# Patient Record
Sex: Female | Born: 1990
Health system: Southern US, Community
[De-identification: ages and names within clinical notes are randomized; demographics above are authoritative.]

## PROBLEM LIST (undated history)

## (undated) ENCOUNTER — Emergency Department (HOSPITAL_COMMUNITY): Admission: EM | Payer: BC Managed Care – PPO

## (undated) DIAGNOSIS — R079 Chest pain, unspecified: Secondary | ICD-10-CM

## (undated) DIAGNOSIS — R519 Headache, unspecified: Secondary | ICD-10-CM

## (undated) DIAGNOSIS — K5190 Ulcerative colitis, unspecified, without complications: Secondary | ICD-10-CM

## (undated) DIAGNOSIS — R51 Headache: Secondary | ICD-10-CM

## (undated) DIAGNOSIS — K219 Gastro-esophageal reflux disease without esophagitis: Secondary | ICD-10-CM

## (undated) HISTORY — DX: Chest pain, unspecified: R07.9

## (undated) HISTORY — PX: OTHER SURGICAL HISTORY: SHX169

---
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
## 2008-02-20 ENCOUNTER — Inpatient Hospital Stay (HOSPITAL_COMMUNITY): Admission: AD | Admit: 2008-02-20 | Discharge: 2008-02-20 | Payer: Self-pay | Admitting: Obstetrics and Gynecology

## 2008-04-14 ENCOUNTER — Emergency Department (HOSPITAL_COMMUNITY): Admission: EM | Admit: 2008-04-14 | Discharge: 2008-04-14 | Payer: Self-pay | Admitting: Emergency Medicine

## 2008-04-14 ENCOUNTER — Encounter: Payer: Self-pay | Admitting: Obstetrics and Gynecology

## 2008-09-04 ENCOUNTER — Ambulatory Visit: Payer: Self-pay | Admitting: Advanced Practice Midwife

## 2008-09-04 ENCOUNTER — Inpatient Hospital Stay (HOSPITAL_COMMUNITY): Admission: AD | Admit: 2008-09-04 | Discharge: 2008-09-05 | Payer: Self-pay | Admitting: Obstetrics & Gynecology

## 2008-09-26 ENCOUNTER — Ambulatory Visit: Payer: Self-pay | Admitting: Advanced Practice Midwife

## 2008-09-26 ENCOUNTER — Inpatient Hospital Stay (HOSPITAL_COMMUNITY): Admission: AD | Admit: 2008-09-26 | Discharge: 2008-09-27 | Payer: Self-pay | Admitting: Obstetrics and Gynecology

## 2008-10-06 ENCOUNTER — Inpatient Hospital Stay (HOSPITAL_COMMUNITY): Admission: AD | Admit: 2008-10-06 | Discharge: 2008-10-06 | Payer: Self-pay | Admitting: Obstetrics and Gynecology

## 2008-10-12 ENCOUNTER — Inpatient Hospital Stay (HOSPITAL_COMMUNITY): Admission: AD | Admit: 2008-10-12 | Discharge: 2008-10-15 | Payer: Self-pay | Admitting: Obstetrics and Gynecology

## 2009-08-16 ENCOUNTER — Emergency Department (HOSPITAL_COMMUNITY): Admission: EM | Admit: 2009-08-16 | Discharge: 2009-08-17 | Payer: Self-pay | Admitting: Emergency Medicine

## 2009-11-07 ENCOUNTER — Ambulatory Visit (HOSPITAL_COMMUNITY): Admission: RE | Admit: 2009-11-07 | Discharge: 2009-11-07 | Payer: Self-pay | Admitting: Obstetrics and Gynecology

## 2010-01-09 IMAGING — US US OB COMP LESS 14 WK
1 series · 14 of 17 positions shown · non-contrast
Comparison: none

OBSTETRICAL ULTRASOUND:
 This ultrasound exam was performed in the [HOSPITAL] Ultrasound Department.  The OB US report was generated in the AS system, and faxed to the ordering physician.  This report is also available in [REDACTED] PACS.

[Series 1: us ob comp less 14 wks · 17 acquisitions, 14 frames shown]
[im 1/17]
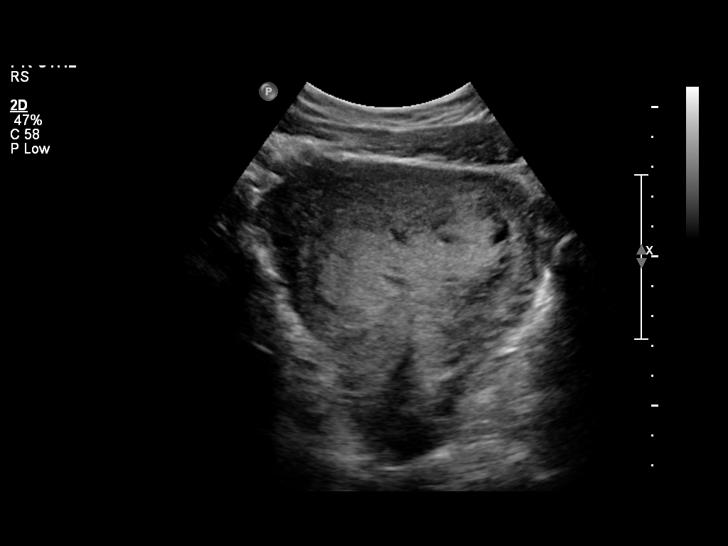
[im 2/17]
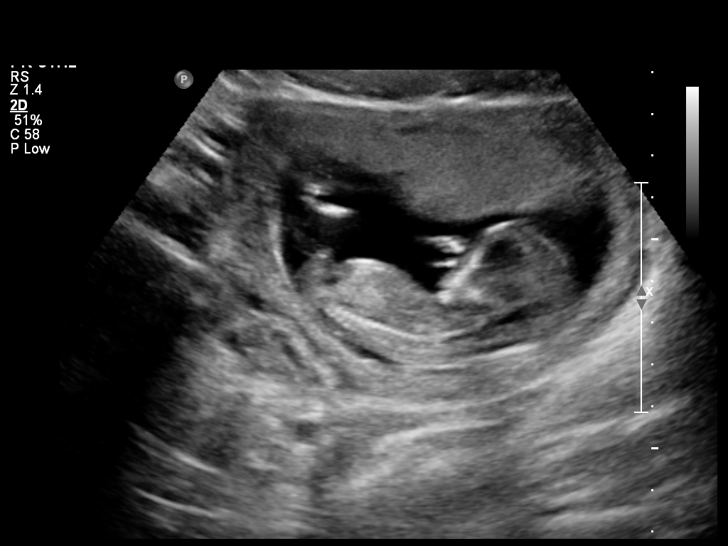
[im 4/17]
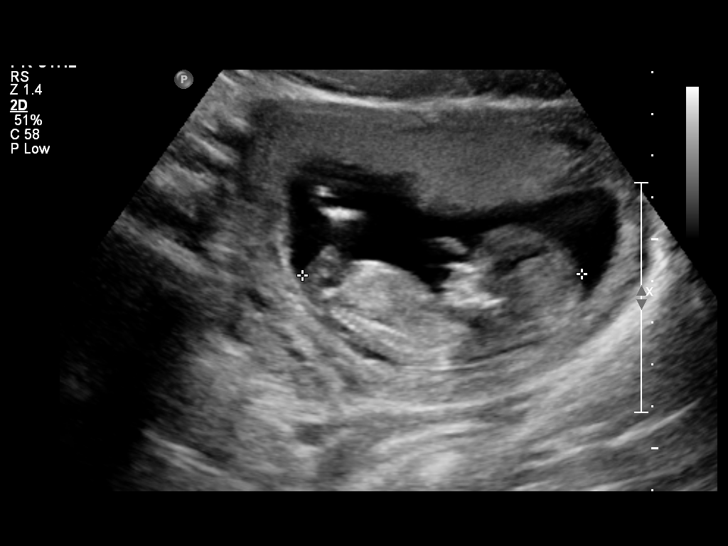
[im 5/17]
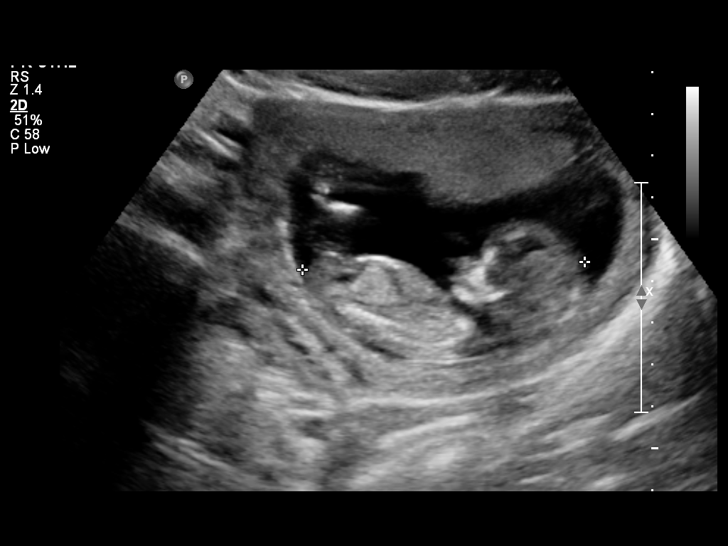
[im 6/17]
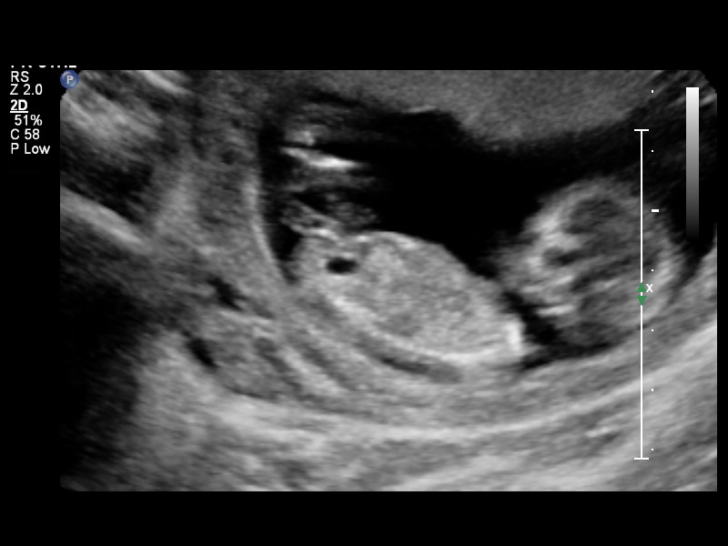
[im 7/17]
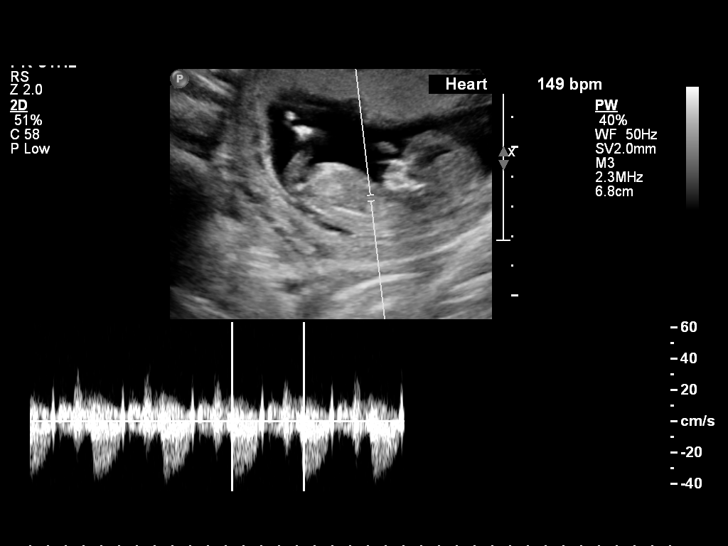
[im 8/17]
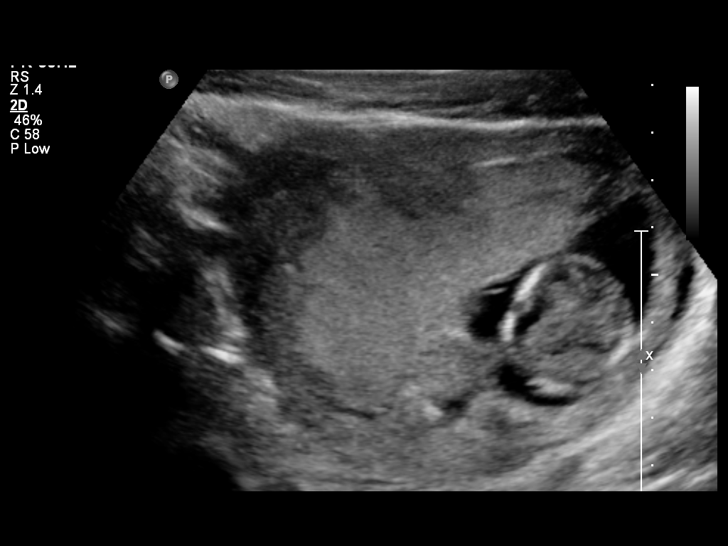
[im 10/17]
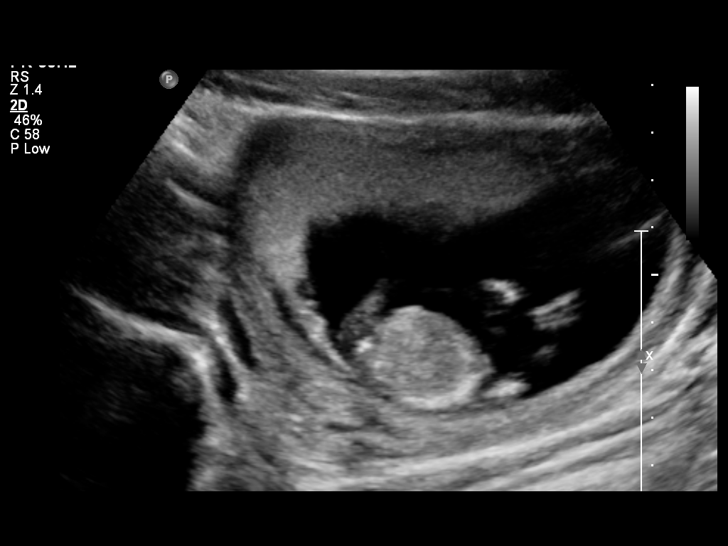
[im 11/17]
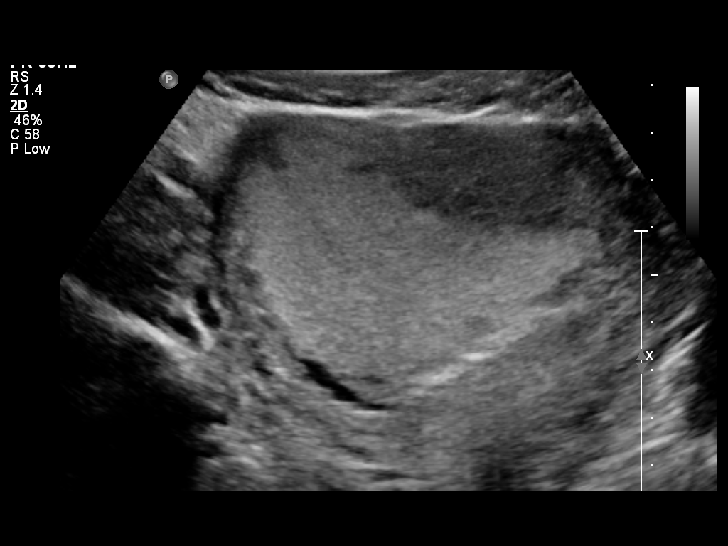
[im 12/17]
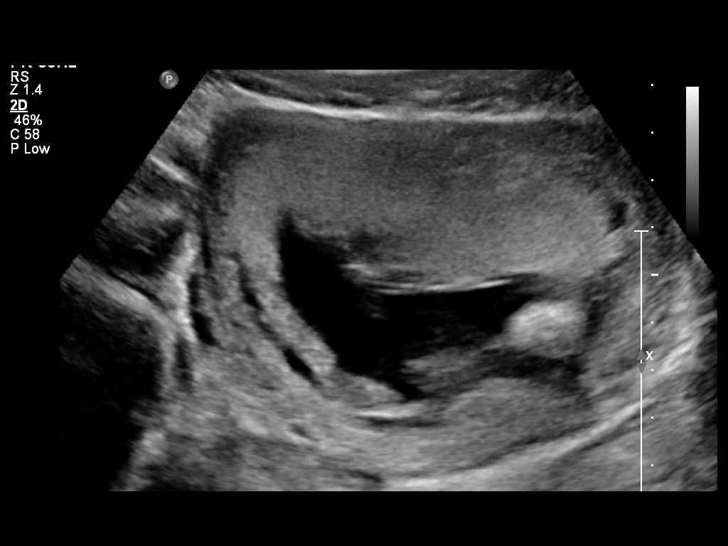
[im 13/17]
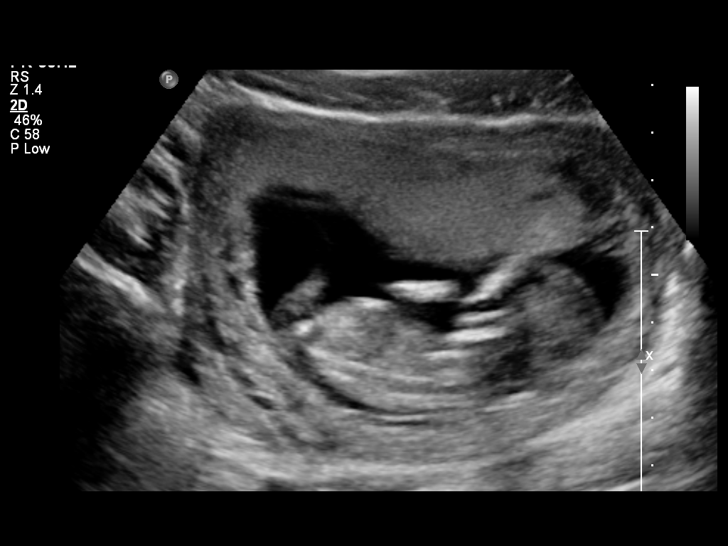
[im 14/17]
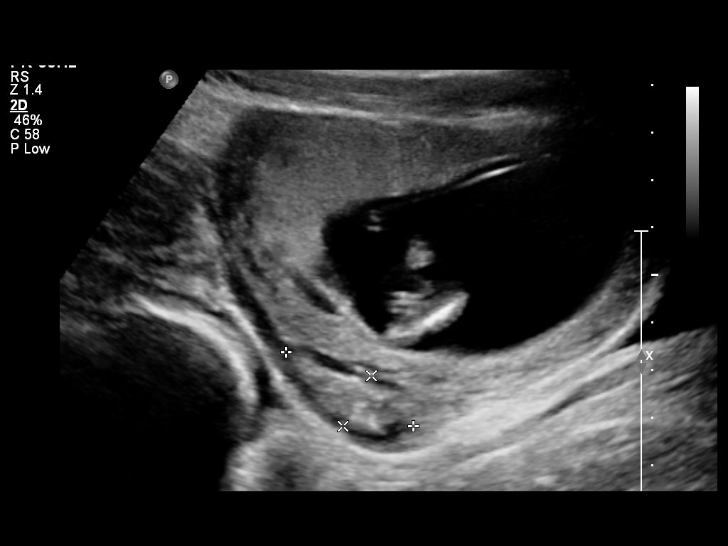
[im 16/17]
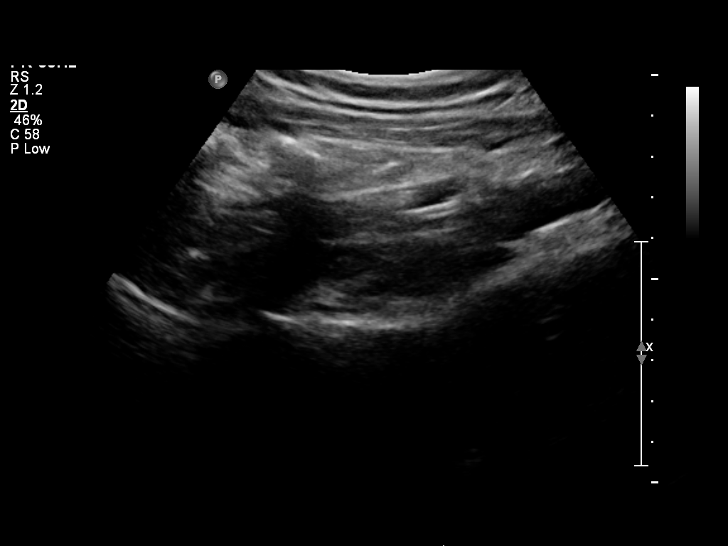
[im 17/17]
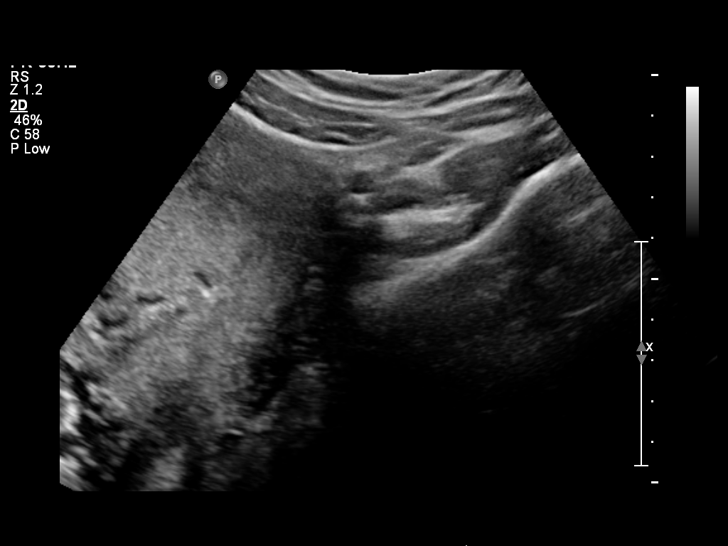

[14 of 17 positions shown; findings below may reference images not displayed]

IMPRESSION: See AS Obstetric US report.

## 2010-06-01 LAB — CBC
HCT: 38.8 % (ref 36.0–46.0)
MCHC: 35 g/dL (ref 30.0–36.0)
MCV: 90.5 fL (ref 78.0–100.0)
Platelets: 309 10*3/uL (ref 150–400)
RDW: 12.7 % (ref 11.5–15.5)
WBC: 6.8 10*3/uL (ref 4.0–10.5)

## 2010-06-01 LAB — URINALYSIS, ROUTINE W REFLEX MICROSCOPIC
Bilirubin Urine: NEGATIVE
Glucose, UA: NEGATIVE mg/dL
Hgb urine dipstick: NEGATIVE
Ketones, ur: NEGATIVE mg/dL
Protein, ur: NEGATIVE mg/dL
Urobilinogen, UA: 0.2 mg/dL (ref 0.0–1.0)

## 2010-06-24 LAB — CBC
HCT: 30.5 % — ABNORMAL LOW (ref 36.0–46.0)
Hemoglobin: 10.4 g/dL — ABNORMAL LOW (ref 12.0–15.0)
MCHC: 33.9 g/dL (ref 30.0–36.0)
MCHC: 34.2 g/dL (ref 30.0–36.0)
MCV: 86.5 fL (ref 78.0–100.0)
MCV: 87.7 fL (ref 78.0–100.0)
Platelets: 281 10*3/uL (ref 150–400)
Platelets: 299 10*3/uL (ref 150–400)
RBC: 3.34 MIL/uL — ABNORMAL LOW (ref 3.87–5.11)
RDW: 14.1 % (ref 11.5–15.5)
RDW: 14.1 % (ref 11.5–15.5)

## 2010-06-25 LAB — WET PREP, GENITAL: Trich, Wet Prep: NONE SEEN

## 2010-06-25 LAB — URINALYSIS, ROUTINE W REFLEX MICROSCOPIC
Glucose, UA: NEGATIVE mg/dL
Ketones, ur: NEGATIVE mg/dL
Leukocytes, UA: NEGATIVE
Protein, ur: 30 mg/dL — AB
Urobilinogen, UA: 0.2 mg/dL (ref 0.0–1.0)

## 2010-06-25 LAB — GC/CHLAMYDIA PROBE AMP, GENITAL
Chlamydia, DNA Probe: NEGATIVE
GC Probe Amp, Genital: NEGATIVE

## 2010-06-25 LAB — FETAL FIBRONECTIN: Fetal Fibronectin: NEGATIVE

## 2010-06-25 LAB — CBC
Hemoglobin: 10.4 g/dL — ABNORMAL LOW (ref 12.0–15.0)
MCHC: 35.1 g/dL (ref 30.0–36.0)
MCV: 89.7 fL (ref 78.0–100.0)
RBC: 3.31 MIL/uL — ABNORMAL LOW (ref 3.87–5.11)
RDW: 12.6 % (ref 11.5–15.5)

## 2010-06-25 LAB — COMPREHENSIVE METABOLIC PANEL
CO2: 21 mEq/L (ref 19–32)
Calcium: 9 mg/dL (ref 8.4–10.5)
Creatinine, Ser: 0.55 mg/dL (ref 0.4–1.2)
GFR calc non Af Amer: >60 mL/min (ref >60)
Glucose, Bld: 75 mg/dL (ref 70–99)

## 2010-06-25 LAB — URINE CULTURE
Colony Count: NO GROWTH
Culture: NO GROWTH

## 2010-06-25 LAB — STREP B DNA PROBE

## 2010-07-02 LAB — WET PREP, GENITAL: Yeast Wet Prep HPF POC: NONE SEEN

## 2010-07-02 LAB — COMPREHENSIVE METABOLIC PANEL
AST: 14 U/L (ref 0–37)
Albumin: 3.5 g/dL (ref 3.5–5.2)
Alkaline Phosphatase: 40 U/L (ref 39–117)
BUN: 3 mg/dL — ABNORMAL LOW (ref 6–23)
Chloride: 104 mEq/L (ref 96–112)
Potassium: 3.7 mEq/L (ref 3.5–5.1)
Total Bilirubin: 0.5 mg/dL (ref 0.3–1.2)

## 2010-07-02 LAB — CBC
HCT: 31.6 % — ABNORMAL LOW (ref 36.0–46.0)
Hemoglobin: 11 g/dL — ABNORMAL LOW (ref 12.0–15.0)
Platelets: 299 10*3/uL (ref 150–400)
RBC: 3.47 MIL/uL — ABNORMAL LOW (ref 3.87–5.11)
WBC: 10.8 10*3/uL — ABNORMAL HIGH (ref 4.0–10.5)

## 2010-07-02 LAB — URINE CULTURE

## 2010-07-02 LAB — URINALYSIS, ROUTINE W REFLEX MICROSCOPIC
Bilirubin Urine: NEGATIVE
Hgb urine dipstick: NEGATIVE
Ketones, ur: 15 mg/dL — AB
Protein, ur: NEGATIVE mg/dL
Urobilinogen, UA: 0.2 mg/dL (ref 0.0–1.0)

## 2010-07-02 LAB — DIFFERENTIAL
Eosinophils Relative: 1 % (ref 0–5)
Lymphocytes Relative: 23 % (ref 12–46)
Lymphs Abs: 2.5 10*3/uL (ref 0.7–4.0)

## 2010-07-02 LAB — GC/CHLAMYDIA PROBE AMP, GENITAL
Chlamydia, DNA Probe: NEGATIVE
GC Probe Amp, Genital: NEGATIVE

## 2010-07-02 LAB — AMYLASE: Amylase: 63 U/L (ref 27–131)

## 2010-07-31 NOTE — Consult Note (Signed)
Robin Aguirre, Robin Aguirre NO.:  [id number]   MEDICAL RECORD NO.:  [id number]          PATIENT TYPE:  EMS   LOCATION:  ED                           FACILITY:  [facility]   PHYSICIAN:  Juanetta Gosling, MDDATE OF BIRTH:  October 09, 1990   DATE OF CONSULTATION:  04/14/2008  DATE OF DISCHARGE:  04/14/2008                                 CONSULTATION   Kista is a 20 year old female with a greater than one year history of  right upper quadrant and occasional bilateral lower quadrant pain who is  now [redacted] weeks pregnant.  She has been seen in this emergency room with  abdominal pain several times over the past year.  She returns today with  continued right upper quadrant and mostly right lower quadrant abdominal  pain right now that has increased, is why she is here.  She has had some  nausea and vomiting since she has been pregnant.  She also has a  supposed history of ulcerative colitis diagnosed in [place] 2 years ago  for which she was on Lialda and steroids for 3 months, but has had not  further medications and no other followup since then.  She is having  bowel movements every 3 days which she said are normal since she has  been pregnant.  She denies any fevers at home.   PAST MEDICAL HISTORY:  Possible ulcerative colitis.   MEDICATIONS:  Prenatal vitamins.   PAST SURGICAL HISTORY:  None.   ALLERGIES:  No known drug allergies.   She is 97.4,  119/70, 88 and 20.  Her abdomen is soft.  No peritoneal  signs.  Tender to palpation right upper quadrant, mild right lower  quadrant with bowel sounds present.   White blood cell count of 10.8, hematocrit of 31.6.  BUN and creatinine  are 30 and 0.39. Lipase 18, amylase 63.  Her liver function tests are  all normal.  Her urinalysis is also negative.  She has an ultrasound which shows no gallstones.  She has some pain with  compression of her gallbladder.  Normal gallbladder wall, no fluid.  MRI  shows a normal gallbladder,  normal bowel.  The appendix is nonvisualized  but there is certainly no evidence of secondary evidence of  appendicitis, no free fluid.   Abdominal pain, primarily right upper quadrant.  The length of the time  course certainly suggests that this is not anything emergent that would  necessitate an operation right now.  Specifically it does not appear she  has pancreatitis, appendicitis and I am not entirely clear this is a  flare of ulcerative colitis since she does not really have any symptoms  that would be referable outside of the pain.  I did tell her that it  would be very important for her to have a primary physician or a  gastroenterologist to manage her ulcerative colitis and not just be  worried that she is  having a flare from time to time.  This also could be her gallbladder  although there are certainly no signs of any acute cholecystitis and the  time course of this would be more indicative of colic although she has  no stones or biliary dyskinesia.  The management of this would be  symptomatic at this time.  I discussed this with her today.      Juanetta Gosling, MD  Electronically Signed     MCW/MEDQ  D:  04/14/2008  T:  04/14/2008  Job:  [id number]

## 2010-07-31 NOTE — Discharge Summary (Signed)
NAMELANNAH, KOIKE                ACCOUNT NO.:  [id number]   MEDICAL RECORD NO.:  [id number]          PATIENT TYPE:  INP   LOCATION:  9151                          FACILITY:  [facility]   PHYSICIAN:  Kendra H. Tenny Craw, MD     DATE OF BIRTH:  [birth year]   DATE OF ADMISSION:  09/04/2008  DATE OF DISCHARGE:  09/05/2008                               DISCHARGE SUMMARY   ADMITTING PHYSICIAN:  Gerrit Friends. Aldona Bar, MD   DISCHARGING PHYSICIAN:  Freddrick March. Tenny Craw, MD   HOSPITAL DIAGNOSES:  1. Preterm contractions.  2. A 33 week and 1 day estimated gestational age.   HOSPITAL PROCEDURES:  1. Continuous fetal monitoring.  2. Betamethasone x2.   HOSPITAL COURSE:  Ms. Dorow is a 20 year old G1, P0, who presented to  MAU complaining of 4 days of uterine cramping and contractions.  In  maternity admission, she was noted to have some contractions on the  monitor.  On cervical exam, she was 170 and -3 station and she was  admitted for preterm contractions.  A fetal fibronectin was also  obtained and it was found to be negative.  She did receive terbutaline  subcutaneously x1 in maternity admission.  She was started on Procardia  on a p.r.n. basis for contractions.  On admission, she did receive  betamethasone IM x1 on September 04, 2008, any repeat dose was performed on  September 05, 2008, 12 hours after her original dose.  On hospital day #2,  she was reevaluated.  The fetal tracing was reactive in the 140s with  accelerations and no decelerations.  Her cervical exam was 170 and -2.  There were some irritability noted on the toco, but otherwise no  contractions noted.  The decision was made to discharge home and given  no cervical change in the face of a negative fetal fibronectin.  The  patient is discharged to home with a prescription for Procardia 10 mg  every 6 hours as needed for contractions and she is advised to keep her  follow up appointment for Wednesday.   LABORATORY DATA:  White count 11.3, hemoglobin  10.4, and platelets 297.  Urinalysis, leukocyte esterase negative, nitrites negative, micro  negative, and fetal fibronectin negative.      Freddrick March. Tenny Craw, MD  Electronically Signed     KHR/MEDQ  D:  09/05/2008  T:  09/06/2008  Job:  [id number]

## 2010-08-01 ENCOUNTER — Inpatient Hospital Stay (HOSPITAL_COMMUNITY)
Admission: AD | Admit: 2010-08-01 | Discharge: 2010-08-01 | Disposition: A | Discharge status: Home / Self Care | Payer: Medicaid Other | Source: Ambulatory Visit | Attending: Obstetrics and Gynecology | Admitting: Obstetrics and Gynecology

## 2010-08-01 DIAGNOSIS — O99891 Other specified diseases and conditions complicating pregnancy: Secondary | ICD-10-CM

## 2010-08-01 DIAGNOSIS — O9989 Other specified diseases and conditions complicating pregnancy, childbirth and the puerperium: Secondary | ICD-10-CM

## 2010-08-05 ENCOUNTER — Inpatient Hospital Stay (HOSPITAL_COMMUNITY): Payer: Medicaid Other

## 2010-08-05 ENCOUNTER — Inpatient Hospital Stay (HOSPITAL_COMMUNITY)
Admission: AD | Admit: 2010-08-05 | Discharge: 2010-08-05 | Disposition: A | Discharge status: Home / Self Care | Payer: Medicaid Other | Source: Ambulatory Visit | Attending: Obstetrics & Gynecology | Admitting: Obstetrics & Gynecology

## 2010-08-05 DIAGNOSIS — R109 Unspecified abdominal pain: Secondary | ICD-10-CM

## 2010-08-05 DIAGNOSIS — O9989 Other specified diseases and conditions complicating pregnancy, childbirth and the puerperium: Secondary | ICD-10-CM

## 2010-08-05 DIAGNOSIS — O99891 Other specified diseases and conditions complicating pregnancy: Secondary | ICD-10-CM

## 2010-08-05 LAB — CBC
MCH: 30.9 pg (ref 26.0–34.0)
MCV: 89 fL (ref 78.0–100.0)
Platelets: 261 10*3/uL (ref 150–400)
RDW: 12.2 % (ref 11.5–15.5)
WBC: 8.8 10*3/uL (ref 4.0–10.5)

## 2010-08-05 LAB — WET PREP, GENITAL
Clue Cells Wet Prep HPF POC: NONE SEEN
Trich, Wet Prep: NONE SEEN
Yeast Wet Prep HPF POC: NONE SEEN

## 2010-08-05 LAB — URINALYSIS, ROUTINE W REFLEX MICROSCOPIC
Glucose, UA: NEGATIVE mg/dL
Protein, ur: NEGATIVE mg/dL
Specific Gravity, Urine: 1.02 (ref 1.005–1.030)
Urobilinogen, UA: 0.2 mg/dL (ref 0.0–1.0)

## 2010-08-05 LAB — HCG, QUANTITATIVE, PREGNANCY: hCG, Beta Chain, Quant, S: 798 m[IU]/mL — ABNORMAL HIGH (ref <5)

## 2010-08-06 ENCOUNTER — Other Ambulatory Visit: Payer: Self-pay | Admitting: Obstetrics & Gynecology

## 2010-08-06 DIAGNOSIS — O3680X0 Pregnancy with inconclusive fetal viability, not applicable or unspecified: Secondary | ICD-10-CM

## 2010-08-14 ENCOUNTER — Ambulatory Visit (HOSPITAL_COMMUNITY)
Admission: RE | Admit: 2010-08-14 | Discharge: 2010-08-14 | Disposition: A | Discharge status: Home / Self Care | Payer: Medicaid Other | Source: Ambulatory Visit | Attending: Obstetrics & Gynecology | Admitting: Obstetrics & Gynecology

## 2010-08-14 ENCOUNTER — Inpatient Hospital Stay (HOSPITAL_COMMUNITY)
Admission: AD | Admit: 2010-08-14 | Discharge: 2010-08-14 | Disposition: A | Discharge status: Home / Self Care | Payer: Medicaid Other | Source: Ambulatory Visit | Attending: Family Medicine | Admitting: Family Medicine

## 2010-08-14 DIAGNOSIS — Z3689 Encounter for other specified antenatal screening: Secondary | ICD-10-CM

## 2010-08-14 DIAGNOSIS — O3680X0 Pregnancy with inconclusive fetal viability, not applicable or unspecified: Secondary | ICD-10-CM

## 2010-08-14 DIAGNOSIS — O99891 Other specified diseases and conditions complicating pregnancy: Secondary | ICD-10-CM | POA: Insufficient documentation

## 2010-08-15 LAB — RPR: RPR: NONREACTIVE

## 2010-08-15 LAB — HIV ANTIBODY (ROUTINE TESTING W REFLEX): HIV: NONREACTIVE

## 2010-08-15 LAB — RUBELLA ANTIBODY, IGM: Rubella: IMMUNE

## 2010-08-31 ENCOUNTER — Inpatient Hospital Stay (HOSPITAL_COMMUNITY)
Admission: AD | Admit: 2010-08-31 | Discharge: 2010-08-31 | Disposition: A | Discharge status: Home / Self Care | Payer: Medicaid Other | Source: Ambulatory Visit | Attending: Obstetrics & Gynecology | Admitting: Obstetrics & Gynecology

## 2010-08-31 DIAGNOSIS — M940 Chondrocostal junction syndrome [Tietze]: Secondary | ICD-10-CM

## 2010-08-31 DIAGNOSIS — O9989 Other specified diseases and conditions complicating pregnancy, childbirth and the puerperium: Secondary | ICD-10-CM

## 2010-08-31 DIAGNOSIS — O99891 Other specified diseases and conditions complicating pregnancy: Secondary | ICD-10-CM

## 2010-08-31 DIAGNOSIS — R109 Unspecified abdominal pain: Secondary | ICD-10-CM | POA: Insufficient documentation

## 2010-10-05 ENCOUNTER — Inpatient Hospital Stay (HOSPITAL_COMMUNITY)
Admission: AD | Admit: 2010-10-05 | Discharge: 2010-10-05 | Disposition: A | Discharge status: Home / Self Care | Payer: Medicaid Other | Source: Ambulatory Visit | Attending: Obstetrics & Gynecology | Admitting: Obstetrics & Gynecology

## 2010-10-05 ENCOUNTER — Encounter (HOSPITAL_COMMUNITY): Payer: Self-pay

## 2010-10-05 DIAGNOSIS — O98819 Other maternal infectious and parasitic diseases complicating pregnancy, unspecified trimester: Secondary | ICD-10-CM | POA: Insufficient documentation

## 2010-10-05 DIAGNOSIS — R112 Nausea with vomiting, unspecified: Secondary | ICD-10-CM

## 2010-10-05 DIAGNOSIS — O219 Vomiting of pregnancy, unspecified: Secondary | ICD-10-CM | POA: Insufficient documentation

## 2010-10-05 DIAGNOSIS — O2390 Unspecified genitourinary tract infection in pregnancy, unspecified trimester: Secondary | ICD-10-CM | POA: Insufficient documentation

## 2010-10-05 DIAGNOSIS — B373 Candidiasis of vulva and vagina: Secondary | ICD-10-CM

## 2010-10-05 DIAGNOSIS — R42 Dizziness and giddiness: Secondary | ICD-10-CM | POA: Insufficient documentation

## 2010-10-05 DIAGNOSIS — N390 Urinary tract infection, site not specified: Secondary | ICD-10-CM | POA: Insufficient documentation

## 2010-10-05 DIAGNOSIS — B3731 Acute candidiasis of vulva and vagina: Secondary | ICD-10-CM | POA: Insufficient documentation

## 2010-10-05 DIAGNOSIS — R109 Unspecified abdominal pain: Secondary | ICD-10-CM | POA: Insufficient documentation

## 2010-10-05 DIAGNOSIS — R51 Headache: Secondary | ICD-10-CM | POA: Insufficient documentation

## 2010-10-05 DIAGNOSIS — O209 Hemorrhage in early pregnancy, unspecified: Secondary | ICD-10-CM

## 2010-10-05 DIAGNOSIS — O99891 Other specified diseases and conditions complicating pregnancy: Secondary | ICD-10-CM | POA: Insufficient documentation

## 2010-10-05 HISTORY — DX: Ulcerative colitis, unspecified, without complications: K51.90

## 2010-10-05 HISTORY — DX: Gastro-esophageal reflux disease without esophagitis: K21.9

## 2010-10-05 LAB — URINALYSIS, ROUTINE W REFLEX MICROSCOPIC
Glucose, UA: NEGATIVE mg/dL
Ketones, ur: 15 mg/dL — AB
pH: 6.5 (ref 5.0–8.0)

## 2010-10-05 LAB — URINE MICROSCOPIC-ADD ON

## 2010-10-05 LAB — CBC
MCV: 86.1 fL (ref 78.0–100.0)
Platelets: 267 10*3/uL (ref 150–400)
RDW: 12.4 % (ref 11.5–15.5)
WBC: 8.9 10*3/uL (ref 4.0–10.5)

## 2010-10-05 LAB — WET PREP, GENITAL: Clue Cells Wet Prep HPF POC: NONE SEEN

## 2010-10-05 MED ORDER — FLUCONAZOLE 150 MG PO TABS: 150.0000 mg | ORAL_TABLET | Freq: Once | ORAL | Status: AC

## 2010-10-05 MED ORDER — PROMETHAZINE HCL 25 MG PO TABS: 25.0000 mg | ORAL_TABLET | Freq: Four times a day (QID) | ORAL | Status: AC | PRN

## 2010-10-05 MED ORDER — BUTALBITAL-APAP-CAFFEINE 50-325-40 MG PO TABS
2.0000 | ORAL_TABLET | Freq: Once | ORAL | Status: AC
  Administered 2010-10-05: 2 via ORAL
  Filled 2010-10-05: qty 2

## 2010-10-05 MED ORDER — CEPHALEXIN 500 MG PO CAPS: 500.0000 mg | ORAL_CAPSULE | Freq: Four times a day (QID) | ORAL | Status: AC

## 2010-10-05 NOTE — ED Provider Notes (Signed)
History    patient is a 20 year old black female gravida 2 para one presents today complaining of nausea vomiting lower abdominal pain. She also noticed some vaginal spotting this morning. She did have intercourse last night. She denies fever. She denies vaginal irritation. He's also had a headache for the past 4 weeks. She states Tylenol helps a little but she continues to have headache.  Chief Complaint  Patient presents with  . Abdominal Pain   HPI  OB History    Grav Para Term Preterm Abortions TAB SAB Ect Mult Living   2 1 1       1       Past Medical History  Diagnosis Date  . GERD (gastroesophageal reflux disease)   . Colitis, chronic, ulcerative     Past Surgical History  Procedure Date  . Colonoscopy   . Removal of iud     No family history on file.  History  Substance Use Topics  . Smoking status: Never Smoker   . Smokeless tobacco: Not on file  . Alcohol Use: No    Allergies: No Known Allergies  Prescriptions prior to admission  Medication Sig Dispense Refill  . acetaminophen (TYLENOL) 500 MG tablet Take 500 mg by mouth every 6 (six) hours as needed. For pain       . acetaminophen-codeine (TYLENOL #3) 300-30 MG per tablet Take 1 tablet by mouth every 4 (four) hours as needed. For pain       . bisacodyl (DULCOLAX) 5 MG EC tablet Take 5 mg by mouth daily as needed.        . cyclobenzaprine (FLEXERIL) 10 MG tablet Take 10 mg by mouth 3 (three) times daily as needed. For muscle spasms       . prenatal vitamin w/FE, FA (PRENATAL 1 + 1) 27-1 MG TABS Take 1 tablet by mouth daily.          Review of Systems  Constitutional: Negative for fever and chills.  Cardiovascular: Negative for chest pain.  Gastrointestinal: Positive for nausea, vomiting and abdominal pain. Negative for diarrhea.  Genitourinary: Negative for dysuria, urgency, frequency and hematuria.  Neurological: Positive for dizziness and headaches.  Psychiatric/Behavioral: Negative for depression and  suicidal ideas.   Physical Exam   Blood pressure 114/70, pulse 86, temperature 98.3 F (36.8 C), temperature source Oral, resp. rate 16, height 5\' 5"  (1.651 m), weight 160 lb 12.8 oz (72.938 kg), SpO2 99.00%.  Physical Exam  Constitutional: She is oriented to person, place, and time. She appears well-developed and well-nourished. No distress.  HENT:  Head: Normocephalic and atraumatic.  Eyes: EOM are normal. Pupils are equal, round, and reactive to light.  GI: She exhibits no distension. There is no tenderness. There is no rebound and no guarding.  Genitourinary: No tenderness or bleeding around the vagina. Vaginal discharge found.       Cervix is long and closed there is no bleeding noted on vaginal exam. She did have a white discharge. Uterus was appropriate size. She is tender over the bladder. No adnexal masses are noted. Fetal heart tones were obtained by Doppler.  Neurological: She is alert and oriented to person, place, and time.  Skin: Skin is warm and dry. She is not diaphoretic.  Psychiatric: She has a normal mood and affect. Her behavior is normal. Judgment and thought content normal.    MAU Course  Procedures  Results for orders placed during the hospital encounter of 10/05/10 (from the past 24 hour(s))  URINALYSIS,  ROUTINE W REFLEX MICROSCOPIC     Status: Abnormal   Collection Time   10/05/10  1:31 PM      Component Value Range   Color, Urine YELLOW  YELLOW    Appearance HAZY (*) CLEAR    Specific Gravity, Urine 1.025  1.005 - 1.030    pH 6.5  5.0 - 8.0    Glucose, UA NEGATIVE  NEGATIVE (mg/dL)   Hgb urine dipstick NEGATIVE  NEGATIVE    Bilirubin Urine SMALL (*) NEGATIVE    Ketones, ur 15 (*) NEGATIVE (mg/dL)   Protein, ur 161 (*) NEGATIVE (mg/dL)   Urobilinogen, UA 1.0  0.0 - 1.0 (mg/dL)   Nitrite NEGATIVE  NEGATIVE    Leukocytes, UA MODERATE (*) NEGATIVE   URINE MICROSCOPIC-ADD ON     Status: Abnormal   Collection Time   10/05/10  1:31 PM      Component Value  Range   Squamous Epithelial / LPF MANY (*) RARE    WBC, UA 11-20  <3 (WBC/hpf)   Bacteria, UA FEW (*) RARE    Crystals CA OXALATE CRYSTALS (*) NEGATIVE    Urine-Other MICROSCOPIC EXAM PERFORMED ON UNCONCENTRATED URINE    WET PREP, GENITAL     Status: Abnormal   Collection Time   10/05/10  2:45 PM      Component Value Range   Yeast, Wet Prep FEW (*) NONE SEEN    Trich, Wet Prep NONE SEEN  NONE SEEN    Clue Cells, Wet Prep NONE SEEN  NONE SEEN    WBC, Wet Prep HPF POC FEW (*) NONE SEEN   CBC     Status: Abnormal   Collection Time   10/05/10  3:10 PM      Component Value Range   WBC 8.9  4.0 - 10.5 (K/uL)   RBC 3.68 (*) 3.87 - 5.11 (MIL/uL)   Hemoglobin 11.3 (*) 12.0 - 15.0 (g/dL)   HCT 09.6 (*) 04.5 - 46.0 (%)   MCV 86.1  78.0 - 100.0 (fL)   MCH 30.7  26.0 - 34.0 (pg)   MCHC 35.6  30.0 - 36.0 (g/dL)   RDW 40.9  81.1 - 91.4 (%)   Platelets 267  150 - 400 (K/uL)    Assessment and Plan  1) UTI: will await cultures. Will prophylactically tx with keflex 500mg  bid x 7 days. She will f/u with Dr. Mechele Collin office.  2) Yeast: will give Rx for diflucan. 3) N&V: will give Rx for phenergan. She has f/u scheduled with Dr. Aldona Bar. Discussed diet, activity, risks, and precautions.  Clinton Gallant. Sherri Levenhagen III, DrHSc, MPAS, PA-C  10/05/2010, 3:08 PM

## 2010-10-05 NOTE — Progress Notes (Signed)
Pt states she had a sudden onset of abdominal pain, started vomiting and felt the need to have a BM but was unable to have one. When she looked in the toilet there was blood. Unsure if in urine or vaginal bleeding. Pt states that pain comes and goes in the upper to the lower abdomen.

## 2010-10-05 NOTE — Progress Notes (Signed)
Pt states "abd cramping since noon today"

## 2010-10-06 LAB — GC/CHLAMYDIA PROBE AMP, GENITAL
Chlamydia, DNA Probe: NEGATIVE
GC Probe Amp, Genital: NEGATIVE

## 2010-10-06 LAB — URINE CULTURE

## 2010-10-09 ENCOUNTER — Inpatient Hospital Stay (HOSPITAL_COMMUNITY)
Admission: AD | Admit: 2010-10-09 | Discharge: 2010-10-10 | Disposition: A | Discharge status: Home / Self Care | Payer: Medicaid Other | Source: Ambulatory Visit | Attending: Obstetrics and Gynecology | Admitting: Obstetrics and Gynecology

## 2010-10-09 ENCOUNTER — Encounter (HOSPITAL_COMMUNITY): Payer: Self-pay | Admitting: *Deleted

## 2010-10-09 DIAGNOSIS — G43909 Migraine, unspecified, not intractable, without status migrainosus: Secondary | ICD-10-CM | POA: Insufficient documentation

## 2010-10-09 DIAGNOSIS — R42 Dizziness and giddiness: Secondary | ICD-10-CM | POA: Insufficient documentation

## 2010-10-09 DIAGNOSIS — W19XXXA Unspecified fall, initial encounter: Secondary | ICD-10-CM | POA: Insufficient documentation

## 2010-10-09 DIAGNOSIS — O9989 Other specified diseases and conditions complicating pregnancy, childbirth and the puerperium: Secondary | ICD-10-CM | POA: Insufficient documentation

## 2010-10-09 LAB — URINE MICROSCOPIC-ADD ON

## 2010-10-09 LAB — URINALYSIS, ROUTINE W REFLEX MICROSCOPIC
Bilirubin Urine: NEGATIVE
Nitrite: NEGATIVE
Specific Gravity, Urine: 1.01 (ref 1.005–1.030)
Urobilinogen, UA: 0.2 mg/dL (ref 0.0–1.0)

## 2010-10-09 MED ORDER — DEXTROSE IN LACTATED RINGERS 5 % IV SOLN
INTRAVENOUS | Status: DC
  Administered 2010-10-09: via INTRAVENOUS

## 2010-10-09 MED ORDER — ONDANSETRON HCL 4 MG/2ML IJ SOLN
4.0000 mg | Freq: Once | INTRAMUSCULAR | Status: AC
  Administered 2010-10-10: 4 mg via INTRAVENOUS
  Filled 2010-10-09: qty 2

## 2010-10-09 MED ORDER — OXYCODONE-ACETAMINOPHEN 5-325 MG PO TABS
2.0000 | ORAL_TABLET | Freq: Once | ORAL | Status: AC
  Administered 2010-10-10: 2 via ORAL
  Filled 2010-10-09: qty 2

## 2010-10-09 NOTE — ED Provider Notes (Signed)
History   The pt is a 20 year-old G2P1001 at 14.[redacted] weeks gestation who presents to MAU for the second time in a week reporting headache since June. She also felt dizzy at work today and fell and hit the right, back area of her head. She did not seek medical care at that time. She stated that she did not eat today and only had a few sips of Ginger Ale. She was Dx w/  UTI at her last visit, but did not start Keflex until today.  Chief Complaint  Patient presents with  . Migraine   HPI  OB History    Grav Para Term Preterm Abortions TAB SAB Ect Mult Living   2 1 1       1       Past Medical History  Diagnosis Date  . GERD (gastroesophageal reflux disease)   . Colitis, chronic, ulcerative     Past Surgical History  Procedure Date  . Colonoscopy   . Removal of iud   . No past surgeries     No family history on file.  History  Substance Use Topics  . Smoking status: Never Smoker   . Smokeless tobacco: Not on file  . Alcohol Use: No    Allergies: No Known Allergies  Prescriptions prior to admission  Medication Sig Dispense Refill  . acetaminophen (TYLENOL) 500 MG tablet Take 500 mg by mouth every 6 (six) hours as needed. For pain       . acetaminophen-codeine (TYLENOL #3) 300-30 MG per tablet Take 1 tablet by mouth every 4 (four) hours as needed. For pain       . cephALEXin (KEFLEX) 500 MG capsule Take 1 capsule (500 mg total) by mouth 4 (four) times daily.  20 capsule  0  . prenatal vitamin w/FE, FA (PRENATAL 1 + 1) 27-1 MG TABS Take 1 tablet by mouth daily.        . promethazine (PHENERGAN) 25 MG tablet Take 1 tablet (25 mg total) by mouth every 6 (six) hours as needed for nausea.  30 tablet  0  . bisacodyl (DULCOLAX) 5 MG EC tablet Take 5 mg by mouth daily as needed.        . cyclobenzaprine (FLEXERIL) 10 MG tablet Take 10 mg by mouth 3 (three) times daily as needed. For muscle spasms         ROS Gen:  She reports nausea, sensitiviy to light and stimulation and denies  fever, chills, flank pain, vomiting. Neuro: Denies weakness, dif w/ speech or gait, vision changes, epigastric pain.  HEENT: dull, constant, sometimes pounding bilat frontal and right posterior pain. No aura.   Physical Exam   Blood pressure 119/61, pulse 83, temperature 98.4 F (36.9 C), resp. rate 20, height 5\' 5"  (1.651 m), weight 72.576 kg (160 lb). Filed Vitals:   10/09/10 2256 10/09/10 2258 10/09/10 2300 10/10/10 0251  BP: 106/52 100/53 96/60 100/55  Pulse: 62 62 79 75  Temp:      Resp: 18 18 18 18   Height:      Weight:         Physical Exam General: Mild-mod distress Neuro exam grossly normal. DTR's 2+, no clonus. Normal speech, symmetric facial mvmts MS: strength and ROM normal and equal bilat HEENT: No bruising, swelling or abrasion at site of reported head injury. Mild tenderness. Sinuses NT  ABD: NT, no CVAT FHR: 150's  Results for orders placed during the hospital encounter of 10/09/10 (from the past 24  hour(s))  URINALYSIS, ROUTINE W REFLEX MICROSCOPIC     Status: Abnormal   Collection Time   10/09/10 10:00 PM      Component Value Range   Color, Urine YELLOW  YELLOW    Appearance CLEAR  CLEAR    Specific Gravity, Urine 1.010  1.005 - 1.030    pH 7.5  5.0 - 8.0    Glucose, UA NEGATIVE  NEGATIVE (mg/dL)   Hgb urine dipstick NEGATIVE  NEGATIVE    Bilirubin Urine NEGATIVE  NEGATIVE    Ketones, ur NEGATIVE  NEGATIVE (mg/dL)   Protein, ur NEGATIVE  NEGATIVE (mg/dL)   Urobilinogen, UA 0.2  0.0 - 1.0 (mg/dL)   Nitrite NEGATIVE  NEGATIVE    Leukocytes, UA SMALL (*) NEGATIVE   URINE MICROSCOPIC-ADD ON     Status: Normal   Collection Time   10/09/10 10:00 PM      Component Value Range   Squamous Epithelial / LPF RARE  RARE    WBC, UA 3-6  <3 (WBC/hpf)   Bacteria, UA RARE  RARE   GLUCOSE, CAPILLARY     Status: Abnormal   Collection Time   10/10/10  1:30 AM      Component Value Range   Glucose-Capillary 133 (*) 70 - 99 (mg/dL)   CBG collected after D5LR bolus  started MAU Course  Procedures  Assessment and Plan  Assessment: 1. Migraine 2. Dizziness and minor fall possibly secondary to hypoglycemia 3. No evidence of head injury 4. Orthostatic VS normal  Plan:  1. Per consult w./ Dr Senaida Ores, bolus 1 L D5LR, Zofran, Percocet  Rutha Melgoza 10/09/2010, 10:56 PM

## 2010-10-09 NOTE — Progress Notes (Signed)
Pt states she has had a migraine since middle of June-states has been dizzy since Fri-also has vomiting and nausea-states she became so dizzy at work she fell on her side and hit the right side of her head-she works at [employer]

## 2010-10-09 NOTE — Progress Notes (Signed)
Pt state she has a headache since June 22.2012 and became worse on 10/05/2010 pt was here because she had a dizzy episode at work. Pt  States she became dizzy at work today 10/09/2010. Pt states she hit her head and the floor.Pt states she was given an appointment and given an appointment for Tomorrow.  Pt states she doesn't know why she is having headaches.

## 2010-10-10 MED ORDER — BUTALBITAL-APAP-CAFFEINE 50-325-40 MG PO TABS: 1.0000 | ORAL_TABLET | Freq: Four times a day (QID) | ORAL | Status: DC | PRN

## 2010-10-10 MED ORDER — IBUPROFEN 600 MG PO TABS
600.0000 mg | ORAL_TABLET | Freq: Once | ORAL | Status: AC
  Administered 2010-10-10: 600 mg via ORAL
  Filled 2010-10-10: qty 1

## 2010-10-10 NOTE — ED Provider Notes (Deleted)
Pt sleeping, easily aroused, denies pain, dizziness.  1. D/C home per consult w/ Senaida Ores 2. Rx Fiorcet 3. F/U Dr. Berenda Morale office later today 4. Recommend small, frequent meals 5. Offered antiemetics, declined 6. Instructed to complete ABX

## 2010-10-10 NOTE — ED Provider Notes (Signed)
Pt sleeping. Reported H/A improved. Dizziness resolved.   Results for orders placed during the hospital encounter of 10/09/10 (from the past 24 hour(s))  URINALYSIS, ROUTINE W REFLEX MICROSCOPIC     Status: Abnormal   Collection Time   10/09/10 10:00 PM      Component Value Range   Color, Urine YELLOW  YELLOW    Appearance CLEAR  CLEAR    Specific Gravity, Urine 1.010  1.005 - 1.030    pH 7.5  5.0 - 8.0    Glucose, UA NEGATIVE  NEGATIVE (mg/dL)   Hgb urine dipstick NEGATIVE  NEGATIVE    Bilirubin Urine NEGATIVE  NEGATIVE    Ketones, ur NEGATIVE  NEGATIVE (mg/dL)   Protein, ur NEGATIVE  NEGATIVE (mg/dL)   Urobilinogen, UA 0.2  0.0 - 1.0 (mg/dL)   Nitrite NEGATIVE  NEGATIVE    Leukocytes, UA SMALL (*) NEGATIVE   URINE MICROSCOPIC-ADD ON     Status: Normal   Collection Time   10/09/10 10:00 PM      Component Value Range   Squamous Epithelial / LPF RARE  RARE    WBC, UA 3-6  <3 (WBC/hpf)   Bacteria, UA RARE  RARE   GLUCOSE, CAPILLARY     Status: Abnormal   Collection Time   10/10/10  1:30 AM      Component Value Range   Glucose-Capillary 133 (*) 70 - 99 (mg/dL)   CBG taken after W0JW started.   Assessment: 1. Suspect hypoglycemia as cause of dizziness and falling. 2. Migraine H/A   Plan:  1. D/C home per consult w/ Dr. Senaida Ores 2. F/U as scheduled later today at Dr. Berenda Morale office 3. Consider Neuro eval for continued H/A 4. Rx Fiorcet 4. Rx: [employer]

## 2010-12-07 ENCOUNTER — Inpatient Hospital Stay (HOSPITAL_COMMUNITY)
Admission: AD | Admit: 2010-12-07 | Discharge: 2010-12-07 | Disposition: A | Discharge status: Home / Self Care | Payer: Medicaid Other | Source: Ambulatory Visit | Attending: Obstetrics and Gynecology | Admitting: Obstetrics and Gynecology

## 2010-12-07 ENCOUNTER — Inpatient Hospital Stay (HOSPITAL_COMMUNITY): Payer: Medicaid Other

## 2010-12-07 DIAGNOSIS — R109 Unspecified abdominal pain: Secondary | ICD-10-CM | POA: Insufficient documentation

## 2010-12-07 DIAGNOSIS — O99891 Other specified diseases and conditions complicating pregnancy: Secondary | ICD-10-CM | POA: Insufficient documentation

## 2010-12-07 DIAGNOSIS — O26899 Other specified pregnancy related conditions, unspecified trimester: Secondary | ICD-10-CM

## 2010-12-07 DIAGNOSIS — O479 False labor, unspecified: Secondary | ICD-10-CM

## 2010-12-07 LAB — WET PREP, GENITAL

## 2010-12-07 LAB — URINE MICROSCOPIC-ADD ON

## 2010-12-07 LAB — URINALYSIS, ROUTINE W REFLEX MICROSCOPIC
Bilirubin Urine: NEGATIVE
Nitrite: NEGATIVE
Protein, ur: NEGATIVE mg/dL
Specific Gravity, Urine: 1.01 (ref 1.005–1.030)
Urobilinogen, UA: 0.2 mg/dL (ref 0.0–1.0)

## 2010-12-07 MED ORDER — CALCIUM CARBONATE ANTACID 500 MG PO CHEW
1.0000 | CHEWABLE_TABLET | Freq: Once | ORAL | Status: AC
  Administered 2010-12-07: 200 mg via ORAL
  Filled 2010-12-07: qty 1

## 2010-12-07 NOTE — Progress Notes (Signed)
Pt states she has been having contractions for several weeks. Have become more frequent.

## 2010-12-07 NOTE — Progress Notes (Signed)
Pt states here for cramping, has been cramping x2 weeks, denies bleeding or lof. Hx ptl prior pregnancy, delivered at 38 wks.

## 2010-12-07 NOTE — ED Provider Notes (Signed)
Subjective  Robin Aguirre is a 20 year old G2 P10 01 at 22 weeks 3 days presenting with abdominal cramps or contractions which she describes as occurring about 2 per hour lasting 30 seconds and perceived as uncomfortable tightening in the upper abdomen. She notices it mostly when she is sitting and says it is not related to movement or fetal activity she is worried due to history of preterm cervical dilatation in last pregnancy. She denies abnormal earlier to the fashion of discharge or dysuria. She is asking to have STD testing due to having issues with her partner at this time. Past Medical History  Diagnosis Date  . GERD (gastroesophageal reflux disease)   . Colitis, chronic, ulcerative    Past Surgical History  Procedure Date  . Colonoscopy   . Removal of iud   . No past surgeries    History   Social History  . Marital Status: Married    Spouse Name: N/A    Number of Children: N/A  . Years of Education: N/A   Occupational History  . Not on file.   Social History Main Topics  . Smoking status: Never Smoker   . Smokeless tobacco: Not on file  . Alcohol Use: No  . Drug Use: No  . Sexually Active: Yes   Other Topics Concern  . Not on file   Social History Narrative  . No narrative on file   Review of Systems  Constitutional: Negative for fever.  Gastrointestinal: Positive for heartburn. Negative for nausea, vomiting and abdominal pain.  Genitourinary: Negative for dysuria, urgency, frequency, hematuria and flank pain.   Objective  Filed Vitals:   12/07/10 1548  BP: 111/69  Pulse: 84  Temp: 98.4 F (36.9 C)  Resp: 16  cervical exam  Gen. WN WD in NAD Abdomen soft flat nontender consistent with [redacted] week gestational age  Pelvic: NEFG, BUS negative  speculum exam: Moderate amount adherently discharge cervix : no lesions in the vagina, no blood.  Vaginal exam: external os 1 internal os closed, long, high Results for orders placed during the hospital encounter of  12/07/10 (from the past 24 hour(s))  URINALYSIS, ROUTINE W REFLEX MICROSCOPIC     Status: Abnormal   Collection Time   12/07/10  4:00 PM      Component Value Range   Color, Urine YELLOW  YELLOW    Appearance CLEAR  CLEAR    Specific Gravity, Urine 1.010  1.005 - 1.030    pH 7.5  5.0 - 8.0    Glucose, UA NEGATIVE  NEGATIVE (mg/dL)   Hgb urine dipstick NEGATIVE  NEGATIVE    Bilirubin Urine NEGATIVE  NEGATIVE    Ketones, ur NEGATIVE  NEGATIVE (mg/dL)   Protein, ur NEGATIVE  NEGATIVE (mg/dL)   Urobilinogen, UA 0.2  0.0 - 1.0 (mg/dL)   Nitrite NEGATIVE  NEGATIVE    Leukocytes, UA TRACE (*) NEGATIVE   URINE MICROSCOPIC-ADD ON     Status: Normal   Collection Time   12/07/10  4:00 PM      Component Value Range   Squamous Epithelial / LPF RARE  RARE    WBC, UA 0-2  <3 (WBC/hpf)  WET PREP, GENITAL     Status: Abnormal   Collection Time   12/07/10  4:37 PM      Component Value Range   Yeast, Wet Prep NONE SEEN  NONE SEEN    Trich, Wet Prep NONE SEEN  NONE SEEN    Clue Cells, Wet Prep NONE  SEEN  NONE SEEN    WBC, Wet Prep HPF POC MODERATE (*) NONE SEEN   Assessment  20 year old G2 P1 01 at 45 and 3 with abdominal discomfort no evidence for preterm labor, UTI, vaginitis  Plan Reassured patient GC and chlamydia were sent per her request and she we notified of any positive results. Will notify Dr. Henderson Cloud of plan of care.  I have assumed care of this pt from Sharolyn Douglas, CNM.   Discussed pt with Dr. Henderson Cloud at length. US shows NL cervical length. Dr. Henderson Cloud wishes to have pt dc'd and f/u in the office. Discussed diet, activity, risks, and precautions.  Clinton Gallant. Al Gagen III, DrHSc, MPAS, PA-C   Henrietta Hoover, [place] 12/07/10 2016

## 2010-12-08 LAB — GC/CHLAMYDIA PROBE AMP, GENITAL
Chlamydia, DNA Probe: NEGATIVE
GC Probe Amp, Genital: NEGATIVE

## 2010-12-11 LAB — POCT URINALYSIS DIP (DEVICE)
Bilirubin Urine: NEGATIVE
Nitrite: NEGATIVE
Protein, ur: NEGATIVE
Urobilinogen, UA: 1
pH: 8.5 — ABNORMAL HIGH

## 2010-12-11 LAB — WET PREP, GENITAL

## 2010-12-11 LAB — POCT PREGNANCY, URINE
Operator id: 247071
Preg Test, Ur: NEGATIVE

## 2010-12-19 LAB — WET PREP, GENITAL: Yeast Wet Prep HPF POC: NONE SEEN

## 2010-12-19 LAB — POCT URINALYSIS DIP (DEVICE)
Bilirubin Urine: NEGATIVE
Glucose, UA: NEGATIVE
Nitrite: NEGATIVE
Operator id: 235561
Urobilinogen, UA: 0.2

## 2010-12-19 LAB — OCCULT BLOOD X 1 CARD TO LAB, STOOL: Fecal Occult Bld: NEGATIVE

## 2010-12-19 LAB — GC/CHLAMYDIA PROBE AMP, GENITAL: Chlamydia, DNA Probe: NEGATIVE

## 2010-12-19 LAB — POCT PREGNANCY, URINE: Preg Test, Ur: NEGATIVE

## 2010-12-21 LAB — WET PREP, GENITAL
Trich, Wet Prep: NONE SEEN
Yeast Wet Prep HPF POC: NONE SEEN

## 2010-12-21 LAB — GC/CHLAMYDIA PROBE AMP, GENITAL
Chlamydia, DNA Probe: NEGATIVE
GC Probe Amp, Genital: NEGATIVE

## 2010-12-21 LAB — CBC
HCT: 34.8 % — ABNORMAL LOW (ref 36.0–46.0)
Hemoglobin: 12.3 g/dL (ref 12.0–15.0)
MCV: 91.9 fL (ref 78.0–100.0)
Platelets: 296 10*3/uL (ref 150–400)
RDW: 12.3 % (ref 11.5–15.5)

## 2011-01-23 ENCOUNTER — Inpatient Hospital Stay (HOSPITAL_COMMUNITY)
Admission: AD | Admit: 2011-01-23 | Discharge: 2011-01-23 | Disposition: A | Discharge status: Home / Self Care | Payer: Medicaid Other | Source: Ambulatory Visit | Attending: Obstetrics & Gynecology | Admitting: Obstetrics & Gynecology

## 2011-01-23 ENCOUNTER — Encounter (HOSPITAL_COMMUNITY): Payer: Self-pay

## 2011-01-23 DIAGNOSIS — R102 Pelvic and perineal pain: Secondary | ICD-10-CM

## 2011-01-23 DIAGNOSIS — O368190 Decreased fetal movements, unspecified trimester, not applicable or unspecified: Secondary | ICD-10-CM | POA: Insufficient documentation

## 2011-01-23 DIAGNOSIS — N949 Unspecified condition associated with female genital organs and menstrual cycle: Secondary | ICD-10-CM

## 2011-01-23 DIAGNOSIS — R109 Unspecified abdominal pain: Secondary | ICD-10-CM | POA: Insufficient documentation

## 2011-01-23 LAB — URINALYSIS, ROUTINE W REFLEX MICROSCOPIC
Glucose, UA: NEGATIVE mg/dL
Hgb urine dipstick: NEGATIVE
Ketones, ur: 15 mg/dL — AB
Leukocytes, UA: NEGATIVE
Protein, ur: NEGATIVE mg/dL
pH: 7.5 (ref 5.0–8.0)

## 2011-01-23 MED ORDER — CALCIUM CARBONATE ANTACID 500 MG PO CHEW
400.0000 mg | CHEWABLE_TABLET | Freq: Once | ORAL | Status: AC
  Administered 2011-01-23: 400 mg via ORAL
  Filled 2011-01-23: qty 2

## 2011-01-23 NOTE — Progress Notes (Signed)
Pt reports pain when baby moves x 2 days. For the last 2 days has had nausea and vomiting, numbness in left upper leg, sharp pain in left mid abd, pressure in lower abd

## 2011-01-23 NOTE — ED Provider Notes (Signed)
History     CSN: [id number] Arrival date & time: 01/23/2011  7:30 PM   None     Chief Complaint  Patient presents with  . Abdominal Pain   HPI Robin Aguirre is a 20 y.o. female @ [redacted]w[redacted]d who presents to [facility] for decreased fetal movement and low abdominal pressure. She feels what she thinks is Braxton Hix contractions because she was feeling the same 2 weeks ago and her doctor told her that was normal.  Tonight at work she began having lower abdominal pressure and since the baby was not moving as much as usual she decided to come in. Denies bleeding or leaking of fluid. Last sexual intercourse was 3 months ago. Scheduled for follow up visit in the office tomorrow.  Past Medical History  Diagnosis Date  . GERD (gastroesophageal reflux disease)   . Colitis, chronic, ulcerative     Past Surgical History  Procedure Date  . Colonoscopy   . Removal of iud   . No past surgeries     History reviewed. No pertinent family history.  History  Substance Use Topics  . Smoking status: Never Smoker   . Smokeless tobacco: Not on file  . Alcohol Use: No    OB History    Grav Para Term Preterm Abortions TAB SAB Ect Mult Living   2 1 1       1       Review of Systems  Constitutional: Negative for fever, chills and appetite change.  HENT: Negative.   Eyes: Negative.   Respiratory: Negative.   Cardiovascular: Negative.   Gastrointestinal: Positive for nausea and abdominal pain.       Lower abdominal pressure  Genitourinary: Negative for dysuria, flank pain, vaginal bleeding, vaginal discharge, difficulty urinating and vaginal pain.  Skin: Negative.   Neurological: Negative for facial asymmetry and headaches.  Psychiatric/Behavioral: Negative for agitation. The patient is not nervous/anxious.     Allergies  Review of patient's allergies indicates no known allergies.  Home Medications  No current outpatient prescriptions on file.  BP 107/58  Pulse 92  Temp 98.4 F (36.9 C)  Resp  18  Ht 5\' 5"  (1.651 m)  Wt 173 lb (78.472 kg)  BMI 28.79 kg/m2  SpO2 97%  Physical Exam  Nursing note and vitals reviewed. Constitutional: She is oriented to person, place, and time. No distress.       obese  HENT:  Head: Normocephalic.  Eyes: EOM are normal.  Neck: Neck supple.  Cardiovascular: Normal rate.   Pulmonary/Chest: Effort normal.  Abdominal: Soft.       Minimal tenderness left side with pressure and movement.  Genitourinary:       External genitalia without lesions. White discharge vaginal vault, external os 1 cm. Internal os closed, thick.   Musculoskeletal: Normal range of motion.  Neurological: She is alert and oriented to person, place, and time. No cranial nerve deficit.  Skin: Skin is warm and dry.  Psychiatric: She has a normal mood and affect. Her behavior is normal. Judgment and thought content normal.    ED Course  Procedures Results for orders placed during the hospital encounter of 01/23/11 (from the past 24 hour(s))  URINALYSIS, ROUTINE W REFLEX MICROSCOPIC     Status: Abnormal   Collection Time   01/23/11  7:45 PM      Component Value Range   Color, Urine YELLOW  YELLOW    Appearance HAZY (*) CLEAR    Specific Gravity, Urine 1.025  1.005 - 1.030    pH 7.5  5.0 - 8.0    Glucose, UA NEGATIVE  NEGATIVE (mg/dL)   Hgb urine dipstick NEGATIVE  NEGATIVE    Bilirubin Urine SMALL (*) NEGATIVE    Ketones, ur 15 (*) NEGATIVE (mg/dL)   Protein, ur NEGATIVE  NEGATIVE (mg/dL)   Urobilinogen, UA 1.0  0.0 - 1.0 (mg/dL)   Nitrite NEGATIVE  NEGATIVE    Leukocytes, UA NEGATIVE  NEGATIVE    EFM shows base line 140, no contractions, reactive.  MDM: Consult with Dr. Arlyce Dice Keep appointment in the office tomorrow.  Assessment: Decreased fetal movement per patient   Reactive tracing   Ligament pain  Plan:  Tylenol for discomfort   Discussed in detail with patient need for follow up as schedule in the office tomorrow   Return here as  needed.         [place], [place] 01/23/11 2116

## 2011-03-15 ENCOUNTER — Inpatient Hospital Stay (HOSPITAL_COMMUNITY)
Admission: AD | Admit: 2011-03-15 | Discharge: 2011-03-16 | Disposition: A | Discharge status: Home / Self Care | Payer: Medicaid Other | Source: Ambulatory Visit | Attending: Obstetrics & Gynecology | Admitting: Obstetrics & Gynecology

## 2011-03-15 ENCOUNTER — Encounter (HOSPITAL_COMMUNITY): Payer: Self-pay | Admitting: Obstetrics and Gynecology

## 2011-03-15 DIAGNOSIS — O479 False labor, unspecified: Secondary | ICD-10-CM | POA: Insufficient documentation

## 2011-03-15 MED ORDER — ZOLPIDEM TARTRATE 10 MG PO TABS
10.0000 mg | ORAL_TABLET | Freq: Once | ORAL | Status: AC
  Administered 2011-03-16: 10 mg via ORAL
  Filled 2011-03-15: qty 1

## 2011-03-15 NOTE — Progress Notes (Signed)
"  I started having a lot of lower abd pressure this morning and it comes and goes.  It is more painful than the contractions.  I had to leave work early today so that I could sleep and forget about the pain."

## 2011-03-15 NOTE — Progress Notes (Signed)
Pt states, " I've had contractions off and on since Tuesday. I was checked yesterday by Dr Dareen Piano and was 3 cm. I've had more pressure, spotting, and stronger contractions. They are also more frequent."

## 2011-03-19 NOTE — L&D Delivery Note (Signed)
NSD of viable 7 pound 9 oz. Female, Apgars 9/9 over intact perineum.  PDI.  Grossly normal.  2A/1V.  No tears.  Breast feed.  Will arrange circ in the office.  Epigastric pain resolved for now.  Will initiate routine PP orders and workup epigastric pain if it recurs.

## 2011-03-21 ENCOUNTER — Inpatient Hospital Stay (HOSPITAL_COMMUNITY)
Admission: AD | Admit: 2011-03-21 | Discharge: 2011-03-22 | Disposition: A | Discharge status: Home / Self Care | Payer: Medicaid Other | Source: Ambulatory Visit | Attending: Obstetrics and Gynecology | Admitting: Obstetrics and Gynecology

## 2011-03-21 ENCOUNTER — Encounter (HOSPITAL_COMMUNITY): Payer: Self-pay | Admitting: *Deleted

## 2011-03-21 DIAGNOSIS — O479 False labor, unspecified: Secondary | ICD-10-CM | POA: Insufficient documentation

## 2011-03-21 NOTE — Progress Notes (Signed)
HURT BAD AT  9 PM.   VE IN OFFICE LAST WEEK-  3 CM.     DENIES HSV AND MRSA.

## 2011-03-22 ENCOUNTER — Encounter (HOSPITAL_COMMUNITY): Payer: Self-pay | Admitting: *Deleted

## 2011-03-22 MED ORDER — ALUM & MAG HYDROXIDE-SIMETH 200-200-20 MG/5ML PO SUSP
30.0000 mL | Freq: Once | ORAL | Status: AC
  Administered 2011-03-22: 30 mL via ORAL
  Filled 2011-03-22: qty 30

## 2011-03-22 MED ORDER — ZOLPIDEM TARTRATE 10 MG PO TABS
10.0000 mg | ORAL_TABLET | Freq: Once | ORAL | Status: AC
  Administered 2011-03-22: 10 mg via ORAL
  Filled 2011-03-22: qty 1

## 2011-03-27 ENCOUNTER — Encounter (HOSPITAL_COMMUNITY): Payer: Self-pay

## 2011-03-27 ENCOUNTER — Encounter (HOSPITAL_COMMUNITY): Payer: Self-pay | Admitting: *Deleted

## 2011-03-27 ENCOUNTER — Encounter (HOSPITAL_COMMUNITY): Payer: Self-pay | Admitting: Anesthesiology

## 2011-03-27 ENCOUNTER — Inpatient Hospital Stay (HOSPITAL_COMMUNITY): Payer: Medicaid Other | Admitting: Anesthesiology

## 2011-03-27 ENCOUNTER — Inpatient Hospital Stay (HOSPITAL_COMMUNITY)
Admission: AD | Admit: 2011-03-27 | Discharge: 2011-03-29 | DRG: 775 | Disposition: A | Discharge status: Home / Self Care | Payer: Medicaid Other | Source: Ambulatory Visit | Attending: Obstetrics & Gynecology | Admitting: Obstetrics & Gynecology

## 2011-03-27 DIAGNOSIS — O09529 Supervision of elderly multigravida, unspecified trimester: Principal | ICD-10-CM | POA: Diagnosis present

## 2011-03-27 DIAGNOSIS — Z3480 Encounter for supervision of other normal pregnancy, unspecified trimester: Secondary | ICD-10-CM

## 2011-03-27 LAB — COMPREHENSIVE METABOLIC PANEL
ALT: 5 U/L (ref 0–35)
AST: 13 U/L (ref 0–37)
Alkaline Phosphatase: 78 U/L (ref 39–117)
CO2: 23 mEq/L (ref 19–32)
Calcium: 8.9 mg/dL (ref 8.4–10.5)
Potassium: 3.4 mEq/L — ABNORMAL LOW (ref 3.5–5.1)
Sodium: 135 mEq/L (ref 135–145)
Total Protein: 6.8 g/dL (ref 6.0–8.3)

## 2011-03-27 LAB — DIFFERENTIAL
Basophils Absolute: 0 10*3/uL (ref 0.0–0.1)
Eosinophils Absolute: 0.1 10*3/uL (ref 0.0–0.7)
Eosinophils Relative: 0 % (ref 0–5)
Lymphocytes Relative: 20 % (ref 12–46)
Neutrophils Relative %: 72 % (ref 43–77)

## 2011-03-27 LAB — URINALYSIS, ROUTINE W REFLEX MICROSCOPIC
Glucose, UA: 100 mg/dL — AB
Ketones, ur: >80 mg/dL — AB
Nitrite: NEGATIVE
Specific Gravity, Urine: 1.02 (ref 1.005–1.030)
pH: 6.5 (ref 5.0–8.0)

## 2011-03-27 LAB — CBC
MCV: 86.7 fL (ref 78.0–100.0)
Platelets: 250 10*3/uL (ref 150–400)
RBC: 3.68 MIL/uL — ABNORMAL LOW (ref 3.87–5.11)
RDW: 13.1 % (ref 11.5–15.5)
WBC: 11.5 10*3/uL — ABNORMAL HIGH (ref 4.0–10.5)

## 2011-03-27 LAB — URINE MICROSCOPIC-ADD ON

## 2011-03-27 MED ORDER — DIBUCAINE 1 % RE OINT: 1.0000 "application " | TOPICAL_OINTMENT | RECTAL | Status: DC | PRN

## 2011-03-27 MED ORDER — BUTORPHANOL TARTRATE 2 MG/ML IJ SOLN: 1.0000 mg | INTRAMUSCULAR | Status: DC | PRN

## 2011-03-27 MED ORDER — LACTATED RINGERS IV SOLN: 500.0000 mL | INTRAVENOUS | Status: DC | PRN

## 2011-03-27 MED ORDER — SIMETHICONE 80 MG PO CHEW
80.0000 mg | CHEWABLE_TABLET | ORAL | Status: DC | PRN
  Administered 2011-03-27 – 2011-03-28 (×2): 80 mg via ORAL

## 2011-03-27 MED ORDER — SODIUM CHLORIDE 0.9 % IV SOLN: 250.0000 mL | INTRAVENOUS | Status: DC | PRN

## 2011-03-27 MED ORDER — ONDANSETRON HCL 4 MG/2ML IJ SOLN
4.0000 mg | Freq: Once | INTRAMUSCULAR | Status: AC
  Administered 2011-03-27: 4 mg via INTRAVENOUS
  Filled 2011-03-27: qty 2

## 2011-03-27 MED ORDER — LACTATED RINGERS IV SOLN
500.0000 mL | Freq: Once | INTRAVENOUS | Status: AC
  Administered 2011-03-27: 1000 mL via INTRAVENOUS

## 2011-03-27 MED ORDER — TERBUTALINE SULFATE 1 MG/ML IJ SOLN: 0.2500 mg | Freq: Once | INTRAMUSCULAR | Status: DC | PRN

## 2011-03-27 MED ORDER — FENTANYL 2.5 MCG/ML BUPIVACAINE 1/10 % EPIDURAL INFUSION (WH - ANES)
14.0000 mL/h | INTRAMUSCULAR | Status: DC
  Filled 2011-03-27: qty 60

## 2011-03-27 MED ORDER — FAMOTIDINE IN NACL 20-0.9 MG/50ML-% IV SOLN
20.0000 mg | Freq: Once | INTRAVENOUS | Status: AC
  Administered 2011-03-27: 20 mg via INTRAVENOUS
  Filled 2011-03-27: qty 50

## 2011-03-27 MED ORDER — WITCH HAZEL-GLYCERIN EX PADS: 1.0000 "application " | MEDICATED_PAD | CUTANEOUS | Status: DC | PRN

## 2011-03-27 MED ORDER — BENZOCAINE-MENTHOL 20-0.5 % EX AERO
1.0000 "application " | INHALATION_SPRAY | CUTANEOUS | Status: DC | PRN
  Administered 2011-03-28: 1 via TOPICAL

## 2011-03-27 MED ORDER — FENTANYL 2.5 MCG/ML BUPIVACAINE 1/10 % EPIDURAL INFUSION (WH - ANES)
INTRAMUSCULAR | Status: DC | PRN
  Administered 2011-03-27: 14 mL/h via EPIDURAL

## 2011-03-27 MED ORDER — OXYTOCIN 20 UNITS IN LACTATED RINGERS INFUSION - SIMPLE
125.0000 mL/h | Freq: Once | INTRAVENOUS | Status: AC
  Administered 2011-03-27: 125 mL/h via INTRAVENOUS

## 2011-03-27 MED ORDER — ONDANSETRON HCL 4 MG/2ML IJ SOLN: 4.0000 mg | Freq: Four times a day (QID) | INTRAMUSCULAR | Status: DC | PRN

## 2011-03-27 MED ORDER — CITRIC ACID-SODIUM CITRATE 334-500 MG/5ML PO SOLN
30.0000 mL | ORAL | Status: DC | PRN
  Administered 2011-03-27: 30 mL via ORAL
  Filled 2011-03-27: qty 15

## 2011-03-27 MED ORDER — ONDANSETRON HCL 4 MG PO TABS: 4.0000 mg | ORAL_TABLET | ORAL | Status: DC | PRN

## 2011-03-27 MED ORDER — LIDOCAINE HCL (PF) 1 % IJ SOLN
30.0000 mL | INTRAMUSCULAR | Status: DC | PRN
  Filled 2011-03-27: qty 30

## 2011-03-27 MED ORDER — FLEET ENEMA 7-19 GM/118ML RE ENEM: 1.0000 | ENEMA | RECTAL | Status: DC | PRN

## 2011-03-27 MED ORDER — ACETAMINOPHEN 325 MG PO TABS: 650.0000 mg | ORAL_TABLET | ORAL | Status: DC | PRN

## 2011-03-27 MED ORDER — IBUPROFEN 600 MG PO TABS
600.0000 mg | ORAL_TABLET | Freq: Four times a day (QID) | ORAL | Status: DC | PRN
  Administered 2011-03-27: 600 mg via ORAL
  Filled 2011-03-27: qty 1

## 2011-03-27 MED ORDER — DIPHENHYDRAMINE HCL 25 MG PO CAPS
25.0000 mg | ORAL_CAPSULE | Freq: Four times a day (QID) | ORAL | Status: DC | PRN
  Administered 2011-03-27 – 2011-03-28 (×4): 25 mg via ORAL
  Filled 2011-03-27 (×4): qty 1

## 2011-03-27 MED ORDER — ZOLPIDEM TARTRATE 5 MG PO TABS: 5.0000 mg | ORAL_TABLET | Freq: Every evening | ORAL | Status: DC | PRN

## 2011-03-27 MED ORDER — SODIUM CHLORIDE 0.9 % IV SOLN
INTRAVENOUS | Status: DC
  Administered 2011-03-27: 10:00:00 via INTRAVENOUS

## 2011-03-27 MED ORDER — ONDANSETRON HCL 4 MG/2ML IJ SOLN: 4.0000 mg | INTRAMUSCULAR | Status: DC | PRN

## 2011-03-27 MED ORDER — LANOLIN HYDROUS EX OINT: TOPICAL_OINTMENT | CUTANEOUS | Status: DC | PRN

## 2011-03-27 MED ORDER — PHENYLEPHRINE 40 MCG/ML (10ML) SYRINGE FOR IV PUSH (FOR BLOOD PRESSURE SUPPORT): 80.0000 ug | PREFILLED_SYRINGE | INTRAVENOUS | Status: DC | PRN

## 2011-03-27 MED ORDER — SENNOSIDES-DOCUSATE SODIUM 8.6-50 MG PO TABS
2.0000 | ORAL_TABLET | Freq: Every day | ORAL | Status: DC
  Administered 2011-03-27 – 2011-03-28 (×2): 2 via ORAL

## 2011-03-27 MED ORDER — SODIUM CHLORIDE 0.9 % IJ SOLN: 3.0000 mL | Freq: Two times a day (BID) | INTRAMUSCULAR | Status: DC

## 2011-03-27 MED ORDER — OXYCODONE-ACETAMINOPHEN 5-325 MG PO TABS
1.0000 | ORAL_TABLET | ORAL | Status: DC | PRN
  Administered 2011-03-28 (×4): 1 via ORAL
  Administered 2011-03-28: 2 via ORAL
  Administered 2011-03-28: 1 via ORAL
  Filled 2011-03-27: qty 1
  Filled 2011-03-27: qty 2
  Filled 2011-03-27 (×4): qty 1

## 2011-03-27 MED ORDER — OXYTOCIN 20 UNITS IN LACTATED RINGERS INFUSION - SIMPLE
1.0000 m[IU]/min | INTRAVENOUS | Status: DC
  Administered 2011-03-27: 2 m[IU]/min via INTRAVENOUS

## 2011-03-27 MED ORDER — HYDROMORPHONE HCL PF 1 MG/ML IJ SOLN
1.0000 mg | Freq: Once | INTRAMUSCULAR | Status: AC
  Administered 2011-03-27: 1 mg via INTRAVENOUS
  Filled 2011-03-27: qty 1

## 2011-03-27 MED ORDER — SODIUM CHLORIDE 0.9 % IJ SOLN: 3.0000 mL | INTRAMUSCULAR | Status: DC | PRN

## 2011-03-27 MED ORDER — IBUPROFEN 600 MG PO TABS
600.0000 mg | ORAL_TABLET | Freq: Four times a day (QID) | ORAL | Status: DC
  Administered 2011-03-28 – 2011-03-29 (×6): 600 mg via ORAL
  Filled 2011-03-27 (×6): qty 1

## 2011-03-27 MED ORDER — OXYTOCIN BOLUS FROM INFUSION
500.0000 mL | Freq: Once | INTRAVENOUS | Status: DC
  Filled 2011-03-27: qty 500
  Filled 2011-03-27: qty 1000

## 2011-03-27 MED ORDER — OXYCODONE-ACETAMINOPHEN 5-325 MG PO TABS: 2.0000 | ORAL_TABLET | ORAL | Status: DC | PRN

## 2011-03-27 MED ORDER — EPHEDRINE 5 MG/ML INJ: 10.0000 mg | INTRAVENOUS | Status: DC | PRN

## 2011-03-27 MED ORDER — EPHEDRINE 5 MG/ML INJ
10.0000 mg | INTRAVENOUS | Status: DC | PRN
  Filled 2011-03-27: qty 4

## 2011-03-27 MED ORDER — DIPHENHYDRAMINE HCL 50 MG/ML IJ SOLN: 12.5000 mg | INTRAMUSCULAR | Status: DC | PRN

## 2011-03-27 MED ORDER — LIDOCAINE HCL 1.5 % IJ SOLN
INTRAMUSCULAR | Status: DC | PRN
  Administered 2011-03-27 (×2): 4 mL via EPIDURAL

## 2011-03-27 MED ORDER — LACTATED RINGERS IV SOLN
INTRAVENOUS | Status: DC
  Administered 2011-03-27: 15:00:00 via INTRAVENOUS

## 2011-03-27 MED ORDER — PRENATAL MULTIVITAMIN CH
1.0000 | ORAL_TABLET | Freq: Every day | ORAL | Status: DC
  Administered 2011-03-28 – 2011-03-29 (×2): 1 via ORAL
  Filled 2011-03-27 (×2): qty 1

## 2011-03-27 MED ORDER — TETANUS-DIPHTH-ACELL PERTUSSIS 5-2.5-18.5 LF-MCG/0.5 IM SUSP
0.5000 mL | Freq: Once | INTRAMUSCULAR | Status: AC
  Administered 2011-03-28: 0.5 mL via INTRAMUSCULAR
  Filled 2011-03-27: qty 0.5

## 2011-03-27 MED ORDER — PHENYLEPHRINE 40 MCG/ML (10ML) SYRINGE FOR IV PUSH (FOR BLOOD PRESSURE SUPPORT)
80.0000 ug | PREFILLED_SYRINGE | INTRAVENOUS | Status: DC | PRN
  Filled 2011-03-27: qty 5

## 2011-03-27 NOTE — Anesthesia Procedure Notes (Signed)
Epidural Patient location during procedure: OB Start time: 03/27/2011 2:49 PM  Staffing Anesthesiologist: Jeremie Giangrande A.  Preanesthetic Checklist Completed: patient identified, site marked, surgical consent, pre-op evaluation, timeout performed, IV checked, risks and benefits discussed and monitors and equipment checked  Epidural Patient position: sitting Prep: site prepped and draped and DuraPrep Patient monitoring: continuous pulse ox and blood pressure Approach: midline Injection technique: LOR air  Needle:  Needle type: Tuohy  Needle gauge: 17 G Needle length: 9 cm Needle insertion depth: 5 cm cm Catheter type: closed end flexible Catheter size: 19 Gauge Catheter at skin depth: 10 cm Test dose: negative and 1.5% lidocaine  Assessment Events: blood not aspirated, injection not painful, no injection resistance, negative IV test and no paresthesia  Additional Notes Patient is more comfortable after epidural dosed. Please see RN's note for documentation of vital signs and FHR which are stable.

## 2011-03-27 NOTE — Progress Notes (Signed)
Delivery of live viable female by Dr Aldona Bar. APGARS 9,9

## 2011-03-27 NOTE — H&P (Signed)
21 year old G2P1 at 38+ weeks who presented to [facility] earlier today with ? Labor and epigastric pain and N/V.  Apparently has been told of potential GB problem at end of previous pregnancy but as her discomfort went away after she was delivered, she never sought followup.  This cuurent problem began over the past several days and progressed to the point where she presented here for evaluation.  Since her presentation this AM, she has received two doses of dilaudid with initial response until it wore off.  Fetal evaluation reassuring.  Her cervix has changed slightly since initial evaluation this AM.  PE:  VS stable. Abd:  Upper abd is soft.  S=D.  Fh has been reactive. CX:  4+/80/ Vtx-2.  IMP:  38+ weeks.  Early Labor.  Epigastric pain etiology uncertain -- Labs have been normal thus far (Amylase, lipase and CMP) Plan:  I feel that expediting delivery is indicated and if epigastric pain persists, workup can be continued after delivery.

## 2011-03-27 NOTE — ED Provider Notes (Signed)
Robin Aguirre is a 21 y.o. female @ [redacted]w[redacted]d who presents to [facility]U for upper abdominal pain. She began feeling bad yesterday afternoon and vomited after trying to eat approximately 5 pm. Woke at 1 am feeling worse and started vomiting again. The pain is in the epigastric area and she complains of heart palpations. The history was provided by the patient.   ROS: As stated in HPI  Exam:  VS: T 98.6, Pulse 120, R 22, O2 Sat 99%  Patient appears uncomfortable. Epigastric pain on palpation. Abdomen soft, gravid consistent with dates. Cervix 4 cm., 80%, -2 station. EFM reactive tracing, contracting every 2 to 4 minutes.   Assessment: Epigastric pain, nausea and vomiting @ 38.[redacted] weeks gestation   Uterine contractions  Plan:  Discussed with Dr. Aldona Bar   Labs, gall bladder ultrasound  Results for orders placed during the hospital encounter of 03/27/11 (from the past 24 hour(s))  URINALYSIS, ROUTINE W REFLEX MICROSCOPIC     Status: Abnormal   Collection Time   03/27/11  8:30 AM      Component Value Range   Color, Urine YELLOW  YELLOW    APPearance CLEAR  CLEAR    Specific Gravity, Urine 1.020  1.005 - 1.030    pH 6.5  5.0 - 8.0    Glucose, UA 100 (*) NEGATIVE (mg/dL)   Hgb urine dipstick MODERATE (*) NEGATIVE    Bilirubin Urine MODERATE (*) NEGATIVE    Ketones, ur >80 (*) NEGATIVE (mg/dL)   Protein, ur 30 (*) NEGATIVE (mg/dL)   Urobilinogen, UA 1.0  0.0 - 1.0 (mg/dL)   Nitrite NEGATIVE  NEGATIVE    Leukocytes, UA SMALL (*) NEGATIVE   URINE MICROSCOPIC-ADD ON     Status: Abnormal   Collection Time   03/27/11  8:30 AM      Component Value Range   Squamous Epithelial / LPF MANY (*) RARE    WBC, UA 0-2  <3 (WBC/hpf)   RBC / HPF 7-10  <3 (RBC/hpf)   Bacteria, UA FEW (*) RARE    Urine-Other MICROSCOPIC EXAM PERFORMED ON UNCONCENTRATED URINE    CBC     Status: Abnormal   Collection Time   03/27/11  9:25 AM      Component Value Range   WBC 11.5 (*) 4.0 - 10.5 (K/uL)   RBC 3.68 (*) 3.87 - 5.11 (MIL/uL)     Hemoglobin 10.8 (*) 12.0 - 15.0 (g/dL)   HCT 45.4 (*) 09.8 - 46.0 (%)   MCV 86.7  78.0 - 100.0 (fL)   MCH 29.3  26.0 - 34.0 (pg)   MCHC 33.9  30.0 - 36.0 (g/dL)   RDW 11.9  14.7 - 82.9 (%)   Platelets 250  150 - 400 (K/uL)  DIFFERENTIAL     Status: Abnormal   Collection Time   03/27/11  9:25 AM      Component Value Range   Neutrophils Relative 72  43 - 77 (%)   Neutro Abs 8.2 (*) 1.7 - 7.7 (K/uL)   Lymphocytes Relative 20  12 - 46 (%)   Lymphs Abs 2.3  0.7 - 4.0 (K/uL)   Monocytes Relative 7  3 - 12 (%)   Monocytes Absolute 0.9  0.1 - 1.0 (K/uL)   Eosinophils Relative 0  0 - 5 (%)   Eosinophils Absolute 0.1  0.0 - 0.7 (K/uL)   Basophils Relative 0  0 - 1 (%)   Basophils Absolute 0.0  0.0 - 0.1 (K/uL)  COMPREHENSIVE METABOLIC  PANEL     Status: Abnormal   Collection Time   03/27/11  9:25 AM      Component Value Range   Sodium 135  135 - 145 (mEq/L)   Potassium 3.4 (*) 3.5 - 5.1 (mEq/L)   Chloride 102  96 - 112 (mEq/L)   CO2 23  19 - 32 (mEq/L)   Glucose, Bld 91  70 - 99 (mg/dL)   BUN 3 (*) 6 - 23 (mg/dL)   Creatinine, Ser 1.61 (*) 0.50 - 1.10 (mg/dL)   Calcium 8.9  8.4 - 09.6 (mg/dL)   Total Protein 6.8  6.0 - 8.3 (g/dL)   Albumin 3.0 (*) 3.5 - 5.2 (g/dL)   AST 13  0 - 37 (U/L)   ALT 5  0 - 35 (U/L)   Alkaline Phosphatase 78  39 - 117 (U/L)   Total Bilirubin 0.6  0.3 - 1.2 (mg/dL)   GFR calc non Af Amer >90  >90 (mL/min)   GFR calc Af Amer >90  >90 (mL/min)  LIPASE, BLOOD     Status: Normal   Collection Time   03/27/11  9:25 AM      Component Value Range   Lipase 16  11 - 59 (U/L)  AMYLASE     Status: Normal   Collection Time   03/27/11  9:25 AM      Component Value Range   Amylase 58  0 - 105 (U/L)   Dr. Aldona Bar in to evaluate the patient.  Medical screening exam complete. Patient with cervical change and Dr. Aldona Bar will admit.   [place], [place] 03/27/11 1721

## 2011-03-27 NOTE — Progress Notes (Signed)
Patient states she started vomiting about 0100 and having upper abdominal pain. Pain continues and does have some contractions. Patient states she has not felt the baby move since the vomiting started. FHR in triage 143. Patient denies leaking or bleeding.

## 2011-03-27 NOTE — Anesthesia Preprocedure Evaluation (Signed)
Anesthesia Evaluation  Patient identified by MRN, date of birth, ID band Patient awake    Reviewed: Allergy & Precautions, H&P , Patient's Chart, lab work & pertinent test results  Airway Mallampati: III TM Distance: >3 FB Neck ROM: full    Dental No notable dental hx. (+) Teeth Intact   Pulmonary neg pulmonary ROS,  clear to auscultation  Pulmonary exam normal       Cardiovascular neg cardio ROS regular Normal    Neuro/Psych Negative Neurological ROS  Negative Psych ROS   GI/Hepatic negative GI ROS, Neg liver ROS, Medicated and Controlled,  Endo/Other  Negative Endocrine ROS  Renal/GU negative Renal ROS  Genitourinary negative   Musculoskeletal   Abdominal   Peds  Hematology negative hematology ROS (+)   Anesthesia Other Findings   Reproductive/Obstetrics (+) Pregnancy                           Anesthesia Physical Anesthesia Plan  ASA: II  Anesthesia Plan: Epidural   Post-op Pain Management:    Induction:   Airway Management Planned:   Additional Equipment:   Intra-op Plan:   Post-operative Plan:   Informed Consent: I have reviewed the patients History and Physical, chart, labs and discussed the procedure including the risks, benefits and alternatives for the proposed anesthesia with the patient or authorized representative who has indicated his/her understanding and acceptance.     Plan Discussed with: Anesthesiologist and Surgeon  Anesthesia Plan Comments:         Anesthesia Quick Evaluation

## 2011-03-28 LAB — CBC
Hemoglobin: 9.8 g/dL — ABNORMAL LOW (ref 12.0–15.0)
MCH: 29.4 pg (ref 26.0–34.0)
RBC: 3.33 MIL/uL — ABNORMAL LOW (ref 3.87–5.11)
WBC: 14.9 10*3/uL — ABNORMAL HIGH (ref 4.0–10.5)

## 2011-03-28 LAB — RPR: RPR Ser Ql: NONREACTIVE

## 2011-03-28 MED ORDER — CALCIUM CARBONATE ANTACID 500 MG PO CHEW
1.0000 | CHEWABLE_TABLET | Freq: Four times a day (QID) | ORAL | Status: DC | PRN
  Administered 2011-03-28: 200 mg via ORAL
  Filled 2011-03-28: qty 1

## 2011-03-28 MED ORDER — FAMOTIDINE 20 MG PO TABS
10.0000 mg | ORAL_TABLET | Freq: Two times a day (BID) | ORAL | Status: DC
  Administered 2011-03-28 – 2011-03-29 (×4): 10 mg via ORAL
  Filled 2011-03-28 (×3): qty 1

## 2011-03-28 MED ORDER — BENZOCAINE-MENTHOL 20-0.5 % EX AERO
INHALATION_SPRAY | CUTANEOUS | Status: AC
  Administered 2011-03-28: 1 via TOPICAL
  Filled 2011-03-28: qty 56

## 2011-03-28 NOTE — Progress Notes (Signed)
UR chart review completed.  

## 2011-03-28 NOTE — Progress Notes (Signed)
Patient complain of heartburn. She has had simethicone x2 with some relief but requests something else. Dr. Aldona Bar notified. Orders to be placed. Will continue to monitor patient.

## 2011-03-28 NOTE — Anesthesia Postprocedure Evaluation (Signed)
  Anesthesia Post-op Note  Patient: Robin Aguirre  Procedure(s) Performed: * No procedures listed *  Patient Location: PACU and Mother/Baby  Anesthesia Type: Epidural  Level of Consciousness: awake, alert  and oriented  Airway and Oxygen Therapy: Patient Spontanous Breathing and Patient connected to nasal cannula oxygen  Post-op Pain: none  Post-op Assessment: Post-op Vital signs reviewed and Patient's Cardiovascular Status Stable  Post-op Vital Signs: Reviewed and stable  Complications: No apparent anesthesia complications

## 2011-03-28 NOTE — Progress Notes (Signed)
Patient is eating, ambulating, voiding.  Pain control is good.  Filed Vitals:   03/27/11 2025 03/27/11 2135 03/28/11 0056 03/28/11 0517  BP: 95/55 100/51 111/74 101/63  Pulse: 96 89 89 73  Temp: 98.2 F (36.8 C) 98 F (36.7 C) 97.9 F (36.6 C) 98.6 F (37 C)  TempSrc: Oral Oral Oral Oral  Resp: 20 20 20 18   Height:      Weight:      SpO2:        Fundus firm Perineum without swelling.  Lab Results  Component Value Date   WBC 14.9* 03/28/2011   HGB 9.8* 03/28/2011   HCT 29.3* 03/28/2011   MCV 88.0 03/28/2011   PLT 228 03/28/2011     /RI/A+  A/P Post partum day 1.  Routine care.  Expect d/c tomorrow.  Circ in office.  Lashanti Chambless A

## 2011-03-29 MED ORDER — IBUPROFEN 600 MG PO TABS: 600.0000 mg | ORAL_TABLET | Freq: Four times a day (QID) | ORAL | Status: AC

## 2011-03-29 MED ORDER — OXYCODONE-ACETAMINOPHEN 5-325 MG PO TABS: 1.0000 | ORAL_TABLET | ORAL | Status: AC | PRN

## 2011-03-29 NOTE — Progress Notes (Signed)
PPD#2 Pt ready for d/c. Lochia-wnl. VSSAF IMP/stable Plan/routine care.

## 2011-03-29 NOTE — Discharge Summary (Signed)
Obstetric Discharge Summary Reason for Admission: induction of labor Prenatal Procedures: ultrasound Intrapartum Procedures: spontaneous vaginal delivery Postpartum Procedures: none Complications-Operative and Postpartum: none Hemoglobin  Date Value Range Status  03/28/2011 9.8* 12.0-15.0 (g/dL) Final     HCT  Date Value Range Status  03/28/2011 29.3* 36.0-46.0 (%) Final    Discharge Diagnoses: Term Pregnancy-delivered  Discharge Information: Date: 03/29/2011 Activity: pelvic rest Diet: routine Medications: Ibuprofen and Percocet Condition: stable Instructions: refer to practice specific booklet Discharge to: home Follow-up Information    Follow up with Caprice Beaver, MD in 4 weeks.   Contact information:   [address] [place] [address] [phone]          Newborn Data: Live born female  Birth Weight: 7 lb 9.2 oz (3436 g) APGAR: 9, 9  Home with mother.  Collins Kerby E 03/29/2011, 8:07 AM

## 2011-07-08 DIAGNOSIS — K219 Gastro-esophageal reflux disease without esophagitis: Secondary | ICD-10-CM | POA: Insufficient documentation

## 2011-07-08 DIAGNOSIS — R1013 Epigastric pain: Secondary | ICD-10-CM | POA: Insufficient documentation

## 2011-07-08 DIAGNOSIS — R1110 Vomiting, unspecified: Secondary | ICD-10-CM | POA: Insufficient documentation

## 2011-07-09 ENCOUNTER — Encounter (HOSPITAL_COMMUNITY): Payer: Self-pay

## 2011-07-09 ENCOUNTER — Emergency Department (HOSPITAL_COMMUNITY)
Admission: EM | Admit: 2011-07-09 | Discharge: 2011-07-09 | Disposition: A | Discharge status: Home / Self Care | Payer: Self-pay | Attending: Emergency Medicine | Admitting: Emergency Medicine

## 2011-07-09 DIAGNOSIS — K2970 Gastritis, unspecified, without bleeding: Secondary | ICD-10-CM

## 2011-07-09 LAB — URINALYSIS, ROUTINE W REFLEX MICROSCOPIC
Glucose, UA: NEGATIVE mg/dL
Hgb urine dipstick: NEGATIVE
Specific Gravity, Urine: 1.002 — ABNORMAL LOW (ref 1.005–1.030)
Urobilinogen, UA: 0.2 mg/dL (ref 0.0–1.0)
pH: 7 (ref 5.0–8.0)

## 2011-07-09 LAB — HEPATIC FUNCTION PANEL
ALT: 18 U/L (ref 0–35)
Albumin: 4.3 g/dL (ref 3.5–5.2)
Alkaline Phosphatase: 66 U/L (ref 39–117)
Total Protein: 7.6 g/dL (ref 6.0–8.3)

## 2011-07-09 LAB — CBC
HCT: 37.8 % (ref 36.0–46.0)
Hemoglobin: 13 g/dL (ref 12.0–15.0)
MCV: 87.7 fL (ref 78.0–100.0)
Platelets: 295 10*3/uL (ref 150–400)
RBC: 4.31 MIL/uL (ref 3.87–5.11)
WBC: 8.6 10*3/uL (ref 4.0–10.5)

## 2011-07-09 LAB — POCT I-STAT, CHEM 8
BUN: 13 mg/dL (ref 6–23)
Calcium, Ion: 1.2 mmol/L (ref 1.12–1.32)
Chloride: 106 mEq/L (ref 96–112)
Glucose, Bld: 93 mg/dL (ref 70–99)

## 2011-07-09 LAB — DIFFERENTIAL
Eosinophils Relative: 1 % (ref 0–5)
Lymphocytes Relative: 35 % (ref 12–46)
Lymphs Abs: 3 10*3/uL (ref 0.7–4.0)

## 2011-07-09 MED ORDER — TRAMADOL HCL 50 MG PO TABS
50.0000 mg | ORAL_TABLET | Freq: Once | ORAL | Status: AC
  Administered 2011-07-09: 50 mg via ORAL
  Filled 2011-07-09: qty 1

## 2011-07-09 MED ORDER — SUCRALFATE 1 GM/10ML PO SUSP: 1.0000 g | Freq: Four times a day (QID) | ORAL | Status: DC

## 2011-07-09 MED ORDER — ONDANSETRON 8 MG PO TBDP
8.0000 mg | ORAL_TABLET | Freq: Once | ORAL | Status: AC
  Administered 2011-07-09: 8 mg via ORAL
  Filled 2011-07-09: qty 1

## 2011-07-09 MED ORDER — ALUM & MAG HYDROXIDE-SIMETH 200-200-20 MG/5ML PO SUSP
30.0000 mL | Freq: Once | ORAL | Status: AC
  Administered 2011-07-09: 30 mL via ORAL
  Filled 2011-07-09: qty 30

## 2011-07-09 MED ORDER — RANITIDINE HCL 150 MG PO TABS: 150.0000 mg | ORAL_TABLET | Freq: Two times a day (BID) | ORAL | Status: DC

## 2011-07-09 NOTE — ED Notes (Signed)
Pt started vomiting tonight after taking some sleeping pills

## 2011-07-09 NOTE — Discharge Instructions (Signed)
Gastritis, Child Stomachaches in children may come from gastritis. This is a soreness (inflammation) of the stomach lining. It can either happen suddenly (acute) or slowly over time (chronic). A stomach or duodenal ulcer may be present at the same time. CAUSES  Gastritis is often caused by an infection of the stomach lining by a bacteria called Helicobacter Pylori. (H. Pylori.) This is the usual cause for primary (not due to other cause) gastritis. Secondary (due to other causes) gastritis may be due to:  Medicines such as aspirin, ibuprofen, steroids, iron, antibiotics and others.   Poisons.   Stress caused by severe burns, recent surgery, severe infections, trauma, etc.   Disease of the intestine or stomach.   Autoimmune disease (where the body's immune system attacks the body).   Sometimes the cause for gastritis is not known.  SYMPTOMS  Symptoms of gastritis in children can differ depending on the age of the child. School-aged children and adolescents have symptoms similar to an adult:  Belly pain - either at the top of the belly or around the belly button. This may or may not be relieved by eating.   Nausea (sometimes with vomiting).   Indigestion.   Decreased appetite.   Feeling bloated.   Belching.  Infants and young children may have:  Feeding problems or decreased appetite.   Unusual fussiness.   Vomiting.  In severe cases, a child may vomit red blood or coffee colored digested blood. Blood may be passed from the rectum as bright red or black stools. DIAGNOSIS  There are several tests that your child's caregiver may do to make the diagnosis.   Tests for H. Pylori. (Breath test, blood test or stomach biopsy)   A small tube is passed through the mouth to view the stomach with a tiny camera (endoscopy).   Blood tests to check causes or side effects of gastritis.   Stool tests for blood.   Imaging (may be done to be sure some other disease is not present)    TREATMENT  For gastritis caused by H. Pylori, your child's caregiver may prescribe one of several medicine combinations. A common combination is called triple therapy (2 antibiotics and 1 proton pump inhibitor (PPI). PPI medicines decrease the amount of stomach acid produced). Other medicines may be used such as:  Antacids.   H2 blockers to decrease the amount of stomach acid.   Medicines to protect the lining of the stomach.  For gastritis not caused by H. Pylori, your child's caregiver may:  Use H2 blockers, PPI's, antacids or medicines to protect the stomach lining.   Remove or treat the cause (if possible).  HOME CARE INSTRUCTIONS   Use all medicine exactly as directed. Take them for the full course even if everything seems to be better in a few days.   Helicobacter infections may be re-tested to make sure the infection has cleared.   Continue all current medicines. Only stop medicines if directed by your child's caregiver.   Avoid caffeine.  SEEK MEDICAL CARE IF:   Problems are getting worse rather than better.   Your child develops black tarry stools.   Problems return after treatment.   Constipation develops.   Diarrhea develops.  SEEK IMMEDIATE MEDICAL CARE IF:  Your child vomits red blood or material that looks like coffee grounds.   Your child is lightheaded or blacks out.   Your child has bright red stools.   Your child vomits repeatedly.   Your child has severe belly pain  or belly tenderness to the touch - especially with fever.   Your child has chest pain or shortness of breath.  Document Released: 05/13/2001 Document Revised: 02/21/2011 Document Reviewed: 01/08/2008 [facility] Patient Information 2012 [place], [place].Gastritis Gastritis is an irritation of the stomach. This is often caused by medications, but can be from anything that bothers the stomach. Other stomach irritants are:  Alcohol.   Caffeine.   Nicotine.   Spicy or acid foods.    Medications for pain and arthritis. Aspirin and other anti-inflammatory medicines such as ibuprofen (Advil), naproxen (Aleve), and ketoprofen (Orudis) can be highly irritating.   Emotional distress.  Symptoms of gastritis may include:  Abdominal pain.   Indigestion.   Nausea and or vomiting.   Bleeding.  Some patients with chronic gastritis and ulcers have been infected by a germ. They may need special testing. Medications which kill germs can be used to cure this condition. Treatment includes avoiding the substances mentioned above that are known to cause stomach trouble. Medications used to treat gastritis can include:  Antacids.   Medicines to control vomiting.   Acid blocking medicines.  Symptoms of gastritis usually improve within 2-3 days of starting treatment. Call your caregiver if you are not better in a few days. SEEK MEDICAL CARE IF:   You have increased stomach or chest pain.   You vomit blood.   You faint or feel lightheaded.   You cannot keep fluids down.   You pass bloody or black stools.   You develop severe back pain.  MAKE SURE YOU:   Understand these instructions.   Will watch your condition.   Will get help right away if you are not doing well or get worse.  Document Released: 03/04/2005 Document Revised: 02/21/2011 Document Reviewed: 08/20/2006 [facility] Patient Information 2012 [place], [place].Gastritis Gastritis is an irritation of the stomach. This is often caused by medications, but can be from anything that bothers the stomach. Other stomach irritants are:  Alcohol.   Caffeine.   Nicotine.   Spicy or acid foods.   Medications for pain and arthritis. Aspirin and other anti-inflammatory medicines such as ibuprofen (Advil), naproxen (Aleve), and ketoprofen (Orudis) can be highly irritating.   Emotional distress.  Symptoms of gastritis may include:  Abdominal pain.   Indigestion.   Nausea and or vomiting.   Bleeding.  Some  patients with chronic gastritis and ulcers have been infected by a germ. They may need special testing. Medications which kill germs can be used to cure this condition. Treatment includes avoiding the substances mentioned above that are known to cause stomach trouble. Medications used to treat gastritis can include:  Antacids.   Medicines to control vomiting.   Acid blocking medicines.  Symptoms of gastritis usually improve within 2-3 days of starting treatment. Call your caregiver if you are not better in a few days. SEEK MEDICAL CARE IF:   You have increased stomach or chest pain.   You vomit blood.   You faint or feel lightheaded.   You cannot keep fluids down.   You pass bloody or black stools.   You develop severe back pain.  MAKE SURE YOU:   Understand these instructions.   Will watch your condition.   Will get help right away if you are not doing well or get worse.  Document Released: 03/04/2005 Document Revised: 02/21/2011 Document Reviewed: 08/20/2006 [facility] Patient Information 2012 [place], [place].

## 2011-07-09 NOTE — ED Provider Notes (Signed)
History     CSN: [id number]  Arrival date & time 07/08/11  2358   First MD Initiated Contact with Patient 07/09/11 0101      Chief Complaint  Patient presents with  . Emesis    (Consider location/radiation/quality/duration/timing/severity/associated sxs/prior treatment) Patient is a 21 y.o. female presenting with vomiting and abdominal pain. The history is provided by the patient. No language interpreter was used.  Emesis  This is a new problem. The current episode started 3 to 5 hours ago. The problem occurs 2 to 4 times per day. The problem has not changed since onset.The emesis has an appearance of stomach contents. There has been no fever. Associated symptoms include abdominal pain. Pertinent negatives include no chills, no diarrhea, no fever, no headaches and no sweats.  Abdominal Pain The primary symptoms of the illness include abdominal pain and vomiting. The primary symptoms of the illness do not include fever or diarrhea. The current episode started 3 to 5 hours ago. The onset of the illness was gradual. The problem has not changed since onset. The abdominal pain began 3 to 5 hours ago. The pain came on gradually. The abdominal pain has been unchanged since its onset. The abdominal pain is located in the epigastric region. The abdominal pain does not radiate. The severity of the abdominal pain is 7/10. The abdominal pain is relieved by nothing. The abdominal pain is exacerbated by vomiting.  The patient states that she believes she is currently not pregnant. The patient has not had a change in bowel habit. Risk factors: none. Symptoms associated with the illness do not include chills. Significant associated medical issues include GERD.  Denies ingestion took one unisom tonight.  No greasy no spicy food ingestion.    Past Medical History  Diagnosis Date  . GERD (gastroesophageal reflux disease)   . Colitis, chronic, ulcerative     Past Surgical History  Procedure Date  .  Colonoscopy   . Removal of iud   . No past surgeries     History reviewed. No pertinent family history.  History  Substance Use Topics  . Smoking status: Never Smoker   . Smokeless tobacco: Not on file  . Alcohol Use: No    OB History    Grav Para Term Preterm Abortions TAB SAB Ect Mult Living   2 2 2       2       Review of Systems  Constitutional: Negative for fever and chills.  Gastrointestinal: Positive for vomiting and abdominal pain. Negative for diarrhea.  Neurological: Negative for headaches.  All other systems reviewed and are negative.    Allergies  Review of patient's allergies indicates no known allergies.  Home Medications   Current Outpatient Rx  Name Route Sig Dispense Refill  . ACETAMINOPHEN 500 MG PO TABS Oral Take 1,000 mg by mouth every 6 (six) hours as needed. Pain    . DOXYLAMINE SUCCINATE (SLEEP) 25 MG PO TABS Oral Take 25 mg by mouth at bedtime as needed. Sleep      BP 119/83  Pulse 113  Temp(Src) 98.6 F (37 C) (Oral)  Resp 18  SpO2 100%  LMP 07/09/2011  Physical Exam  Constitutional: She is oriented to person, place, and time. She appears well-developed and well-nourished. No distress.  HENT:  Head: Normocephalic and atraumatic.  Mouth/Throat: Oropharynx is clear and moist.  Eyes: Conjunctivae are normal. Pupils are equal, round, and reactive to light.  Neck: Normal range of motion. Neck supple.  Cardiovascular:  Normal rate and regular rhythm.   Pulmonary/Chest: Effort normal and breath sounds normal.  Abdominal: Soft. Bowel sounds are normal. She exhibits no mass. There is tenderness in the epigastric area. There is no rebound and no guarding.    Musculoskeletal: Normal range of motion.  Neurological: She is alert and oriented to person, place, and time.  Skin: Skin is warm and dry.  Psychiatric: She has a normal mood and affect.    ED Course  Procedures (including critical care time)  Labs Reviewed  URINALYSIS, ROUTINE W  REFLEX MICROSCOPIC - Abnormal; Notable for the following:    APPearance CLOUDY (*)    Specific Gravity, Urine 1.002 (*)    All other components within normal limits  HEPATIC FUNCTION PANEL - Abnormal; Notable for the following:    Total Bilirubin 0.2 (*)    All other components within normal limits  CBC  DIFFERENTIAL  POCT PREGNANCY, URINE  POCT I-STAT, CHEM 8   No results found.   No diagnosis found.    MDM  Labs and exam reassuring.  No indication for imaging at this time.  Follow up with your family doctor.  Return for fevers chills, pain that is intractable, peristent vomiting or any concerns.  Patient verbalizes understanding and agrees to follow up.  Consult with your pediatrician before taking any medications because of breastfeeding        Kauri Garson K Tuleen Mandelbaum-Rasch, MD 07/09/11 8119

## 2012-02-21 ENCOUNTER — Emergency Department (HOSPITAL_COMMUNITY): Payer: Self-pay

## 2012-02-21 ENCOUNTER — Encounter (HOSPITAL_COMMUNITY): Payer: Self-pay | Admitting: Emergency Medicine

## 2012-02-21 ENCOUNTER — Emergency Department (HOSPITAL_COMMUNITY)
Admission: EM | Admit: 2012-02-21 | Discharge: 2012-02-22 | Disposition: A | Discharge status: Home / Self Care | Payer: Self-pay | Attending: Emergency Medicine | Admitting: Emergency Medicine

## 2012-02-21 DIAGNOSIS — R51 Headache: Secondary | ICD-10-CM | POA: Insufficient documentation

## 2012-02-21 DIAGNOSIS — J029 Acute pharyngitis, unspecified: Secondary | ICD-10-CM | POA: Insufficient documentation

## 2012-02-21 DIAGNOSIS — Z8719 Personal history of other diseases of the digestive system: Secondary | ICD-10-CM | POA: Insufficient documentation

## 2012-02-21 DIAGNOSIS — R5383 Other fatigue: Secondary | ICD-10-CM | POA: Insufficient documentation

## 2012-02-21 DIAGNOSIS — R05 Cough: Secondary | ICD-10-CM | POA: Insufficient documentation

## 2012-02-21 DIAGNOSIS — R509 Fever, unspecified: Secondary | ICD-10-CM | POA: Insufficient documentation

## 2012-02-21 DIAGNOSIS — J3489 Other specified disorders of nose and nasal sinuses: Secondary | ICD-10-CM | POA: Insufficient documentation

## 2012-02-21 DIAGNOSIS — R6889 Other general symptoms and signs: Secondary | ICD-10-CM

## 2012-02-21 DIAGNOSIS — R5381 Other malaise: Secondary | ICD-10-CM | POA: Insufficient documentation

## 2012-02-21 DIAGNOSIS — IMO0001 Reserved for inherently not codable concepts without codable children: Secondary | ICD-10-CM | POA: Insufficient documentation

## 2012-02-21 DIAGNOSIS — R059 Cough, unspecified: Secondary | ICD-10-CM | POA: Insufficient documentation

## 2012-02-21 LAB — URINALYSIS, ROUTINE W REFLEX MICROSCOPIC
Bilirubin Urine: NEGATIVE
Glucose, UA: NEGATIVE mg/dL
Specific Gravity, Urine: 1.021 (ref 1.005–1.030)
pH: 7.5 (ref 5.0–8.0)

## 2012-02-21 LAB — CBC WITH DIFFERENTIAL/PLATELET
Eosinophils Relative: 0 % (ref 0–5)
HCT: 37.7 % (ref 36.0–46.0)
Lymphocytes Relative: 16 % (ref 12–46)
Lymphs Abs: 1.3 10*3/uL (ref 0.7–4.0)
MCV: 87.7 fL (ref 78.0–100.0)
Monocytes Absolute: 0.5 10*3/uL (ref 0.1–1.0)
RBC: 4.3 MIL/uL (ref 3.87–5.11)
WBC: 8 10*3/uL (ref 4.0–10.5)

## 2012-02-21 LAB — COMPREHENSIVE METABOLIC PANEL
AST: 12 U/L (ref 0–37)
Albumin: 3.9 g/dL (ref 3.5–5.2)
Alkaline Phosphatase: 67 U/L (ref 39–117)
BUN: 4 mg/dL — ABNORMAL LOW (ref 6–23)
Chloride: 100 mEq/L (ref 96–112)
Sodium: 134 mEq/L — ABNORMAL LOW (ref 135–145)
Total Protein: 7.5 g/dL (ref 6.0–8.3)

## 2012-02-21 LAB — LIPASE, BLOOD: Lipase: 18 U/L (ref 11–59)

## 2012-02-21 LAB — URINE MICROSCOPIC-ADD ON

## 2012-02-21 MED ORDER — ACETAMINOPHEN 325 MG PO TABS
650.0000 mg | ORAL_TABLET | Freq: Once | ORAL | Status: AC
  Administered 2012-02-21: 650 mg via ORAL
  Filled 2012-02-21: qty 2

## 2012-02-21 NOTE — ED Notes (Signed)
Pt alert, arrives from home, c/o sore throat, cough, onset was a few days ago, c/o low abd pain, denies n/v or diarrhea, resp even unlabored, skin pwd

## 2012-02-21 NOTE — ED Notes (Signed)
Patient transported to X-ray 

## 2012-02-22 MED ORDER — IBUPROFEN 800 MG PO TABS
800.0000 mg | ORAL_TABLET | Freq: Once | ORAL | Status: AC
  Administered 2012-02-22: 800 mg via ORAL
  Filled 2012-02-22: qty 1

## 2012-02-22 MED ORDER — HYDROCOD POLST-CHLORPHEN POLST 10-8 MG/5ML PO LQCR
5.0000 mL | Freq: Once | ORAL | Status: AC
  Administered 2012-02-22: 5 mL via ORAL
  Filled 2012-02-22: qty 5

## 2012-02-22 MED ORDER — HYDROCODONE-HOMATROPINE 5-1.5 MG/5ML PO SYRP: 5.0000 mL | ORAL_SOLUTION | Freq: Four times a day (QID) | ORAL | Status: DC | PRN

## 2012-02-22 NOTE — ED Provider Notes (Signed)
History     CSN: [id number]  Arrival date & time 02/21/12  2012   First MD Initiated Contact with Patient 02/21/12 2125      Chief Complaint  Patient presents with  . Sore Throat  . Cough    (Consider location/radiation/quality/duration/timing/severity/associated sxs/prior treatment) HPI Robin Aguirre is a 21 y.o. female who presents with complaint of fever, headache, sore throat, cough. Pt states symptoms began 3 days ago. States taking nyquil, tylenol with no relief. Denies photophobia, neck pain or stiffness, nausea, vomiting. States "i just feel bad." did not get a flu shot this year. No one around her sick with the same.  Past Medical History  Diagnosis Date  . GERD (gastroesophageal reflux disease)   . Colitis, chronic, ulcerative     Past Surgical History  Procedure Date  . Colonoscopy   . Removal of iud   . No past surgeries     No family history on file.  History  Substance Use Topics  . Smoking status: Never Smoker   . Smokeless tobacco: Not on file  . Alcohol Use: No    OB History    Grav Para Term Preterm Abortions TAB SAB Ect Mult Living   2 2 2       2       Review of Systems  Constitutional: Positive for fever, chills and fatigue.  HENT: Positive for congestion and sore throat. Negative for ear pain, neck pain and neck stiffness.   Eyes: Negative for photophobia.  Respiratory: Positive for cough. Negative for chest tightness, shortness of breath and wheezing.   Cardiovascular: Negative for chest pain and leg swelling.  Gastrointestinal: Negative for nausea, vomiting and abdominal pain.  Genitourinary: Negative for dysuria and flank pain.  Musculoskeletal: Positive for myalgias.  Skin: Negative for rash.  Neurological: Positive for weakness and headaches. Negative for dizziness and numbness.  Psychiatric/Behavioral: Negative.     Allergies  Review of patient's allergies indicates no known allergies.  Home Medications   Current Outpatient  Rx  Name  Route  Sig  Dispense  Refill  . ACETAMINOPHEN 500 MG PO TABS   Oral   Take 1,000 mg by mouth every 6 (six) hours as needed. Pain         . PSEUDOEPH-DOXYLAMINE-DM-APAP 60-7.08-14-998 MG/30ML PO LIQD   Oral   Take 30 mLs by mouth once.         Marland Kitchen HYDROCODONE-HOMATROPINE 5-1.5 MG/5ML PO SYRP   Oral   Take 5 mLs by mouth every 6 (six) hours as needed for cough.   120 mL   0     BP 114/67  Pulse 100  Temp 101.7 F (38.7 C) (Oral)  Resp 17  SpO2 97%  LMP 02/21/2012  Physical Exam  Nursing note and vitals reviewed. Constitutional: She is oriented to person, place, and time. She appears well-developed and well-nourished. No distress.  HENT:  Head: Normocephalic and atraumatic.  Right Ear: Tympanic membrane, external ear and ear canal normal.  Left Ear: Tympanic membrane, external ear and ear canal normal.  Nose: Rhinorrhea present. Right sinus exhibits no maxillary sinus tenderness and no frontal sinus tenderness. Left sinus exhibits no maxillary sinus tenderness and no frontal sinus tenderness.  Mouth/Throat: Mucous membranes are normal. No uvula swelling. Posterior oropharyngeal erythema present. No oropharyngeal exudate, posterior oropharyngeal edema or tonsillar abscesses.  Eyes: Conjunctivae normal are normal.  Neck: Normal range of motion. Neck supple.       No meningismus  Cardiovascular: Normal  rate, regular rhythm and normal heart sounds.   Pulmonary/Chest: Effort normal and breath sounds normal. No respiratory distress. She has no wheezes. She has no rales.  Abdominal: Soft. Bowel sounds are normal. She exhibits no distension. There is no tenderness. There is no rebound.  Lymphadenopathy:    She has no cervical adenopathy.  Neurological: She is alert and oriented to person, place, and time.  Skin: Skin is warm and dry. No rash noted.    ED Course  Procedures (including critical care time)  Results for orders placed during the hospital encounter of  02/21/12  PREGNANCY, URINE      Component Value Range   Preg Test, Ur NEGATIVE  NEGATIVE  URINALYSIS, ROUTINE W REFLEX MICROSCOPIC      Component Value Range   Color, Urine YELLOW  YELLOW   APPearance CLEAR  CLEAR   Specific Gravity, Urine 1.021  1.005 - 1.030   pH 7.5  5.0 - 8.0   Glucose, UA NEGATIVE  NEGATIVE mg/dL   Hgb urine dipstick LARGE (*) NEGATIVE   Bilirubin Urine NEGATIVE  NEGATIVE   Ketones, ur NEGATIVE  NEGATIVE mg/dL   Protein, ur NEGATIVE  NEGATIVE mg/dL   Urobilinogen, UA 1.0  0.0 - 1.0 mg/dL   Nitrite NEGATIVE  NEGATIVE   Leukocytes, UA SMALL (*) NEGATIVE  RAPID STREP SCREEN      Component Value Range   Streptococcus, Group A Screen (Direct) NEGATIVE  NEGATIVE  CBC WITH DIFFERENTIAL      Component Value Range   WBC 8.0  4.0 - 10.5 K/uL   RBC 4.30  3.87 - 5.11 MIL/uL   Hemoglobin 13.1  12.0 - 15.0 g/dL   HCT 16.1  09.6 - 04.5 %   MCV 87.7  78.0 - 100.0 fL   MCH 30.5  26.0 - 34.0 pg   MCHC 34.7  30.0 - 36.0 g/dL   RDW 40.9  81.1 - 91.4 %   Platelets 243  150 - 400 K/uL   Neutrophils Relative 77  43 - 77 %   Neutro Abs 6.2  1.7 - 7.7 K/uL   Lymphocytes Relative 16  12 - 46 %   Lymphs Abs 1.3  0.7 - 4.0 K/uL   Monocytes Relative 7  3 - 12 %   Monocytes Absolute 0.5  0.1 - 1.0 K/uL   Eosinophils Relative 0  0 - 5 %   Eosinophils Absolute 0.0  0.0 - 0.7 K/uL   Basophils Relative 0  0 - 1 %   Basophils Absolute 0.0  0.0 - 0.1 K/uL  COMPREHENSIVE METABOLIC PANEL      Component Value Range   Sodium 134 (*) 135 - 145 mEq/L   Potassium 3.7  3.5 - 5.1 mEq/L   Chloride 100  96 - 112 mEq/L   CO2 23  19 - 32 mEq/L   Glucose, Bld 103 (*) 70 - 99 mg/dL   BUN 4 (*) 6 - 23 mg/dL   Creatinine, Ser 7.82  0.50 - 1.10 mg/dL   Calcium 9.0  8.4 - 95.6 mg/dL   Total Protein 7.5  6.0 - 8.3 g/dL   Albumin 3.9  3.5 - 5.2 g/dL   AST 12  0 - 37 U/L   ALT 7  0 - 35 U/L   Alkaline Phosphatase 67  39 - 117 U/L   Total Bilirubin 0.2 (*) 0.3 - 1.2 mg/dL   GFR calc non Af Amer  >90  >90 mL/min   GFR  calc Af Amer >90  >90 mL/min  LIPASE, BLOOD      Component Value Range   Lipase 18  11 - 59 U/L  URINE MICROSCOPIC-ADD ON      Component Value Range   Squamous Epithelial / LPF RARE  RARE   WBC, UA 3-6  <3 WBC/hpf   RBC / HPF 7-10  <3 RBC/hpf   Bacteria, UA RARE  RARE   Dg Chest 2 View  02/21/2012  *RADIOLOGY REPORT*  Clinical Data: Cough.  Pharyngitis.  CHEST - 2 VIEW  Comparison: None.  Findings: Normal sized heart.  Clear lungs.  Minimal scoliosis.  IMPRESSION: No acute abnormality.   Original Report Authenticated By: Beckie Salts, M.D.       1. Flu-like symptoms       MDM  Pt with flul like symptoms. Her exam does not indicate any signs of pharyngitis, meningitis. NO n/v/d. Lungs are clear, she denies SOB. Pt's hr and temp improved with tylenol. It is possible that pt may have influenza. Stable here, d/c home with follow up and return if worsening.         Lottie Mussel, PA 02/23/12 0148  Lottie Mussel, PA 02/23/12 9562  Doug Sou, MD 04/02/14 [phone]

## 2012-02-23 NOTE — ED Provider Notes (Signed)
Medical screening examination/treatment/procedure(s) were performed by non-physician practitioner and as supervising physician I was immediately available for consultation/collaboration.  Doug Sou, MD 02/23/12 1451

## 2012-05-01 IMAGING — US US OB TRANSVAGINAL
1 series · 13 of 28 positions shown · non-contrast
Comparison: Prior ultrasound of pregnancy and MRI of the abdomen
and pelvis performed 04/14/2008

CLINICAL DATA: Abdominal cramping for 4 days.

OBSTETRIC <14 WK US AND TRANSVAGINAL OB US
TECHNIQUE: Both transabdominal and transvaginal ultrasound
examinations were performed for complete evaluation of the
gestation as well as the maternal uterus, adnexal regions, and
pelvic cul-de-sac.  Transvaginal technique was performed to assess
early pregnancy.

[Series 1: us ob comp less 14 wks · 13 of 45 slices shown]
[im 2/45]
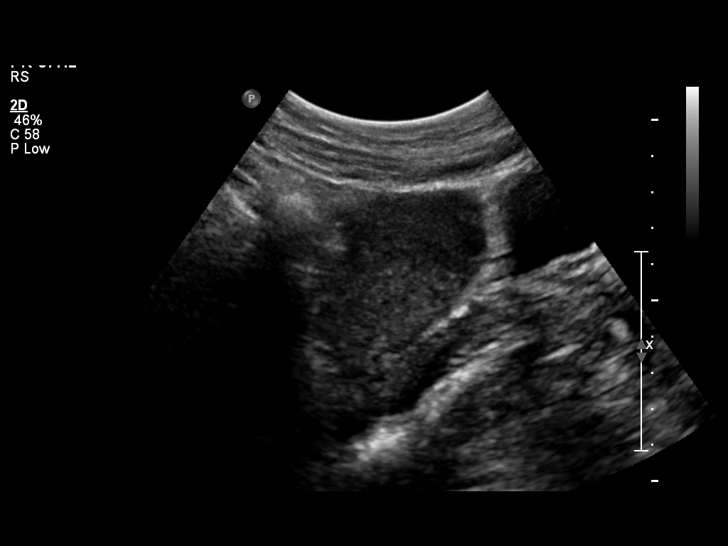
[im 5/45]
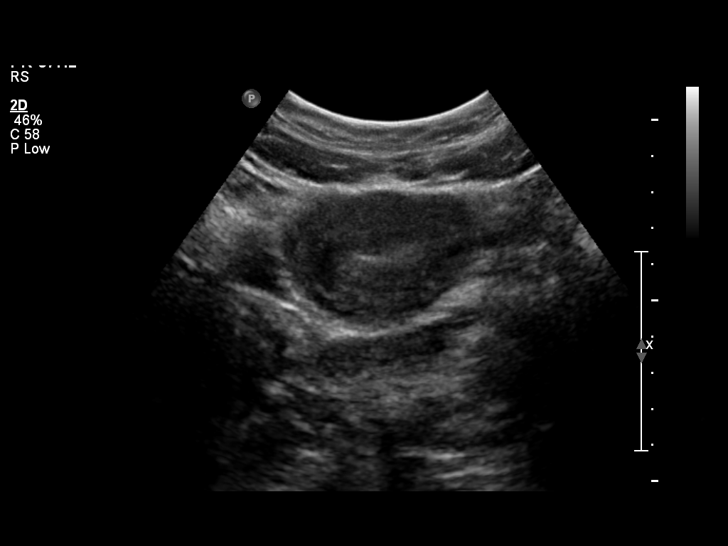
[im 9/45]
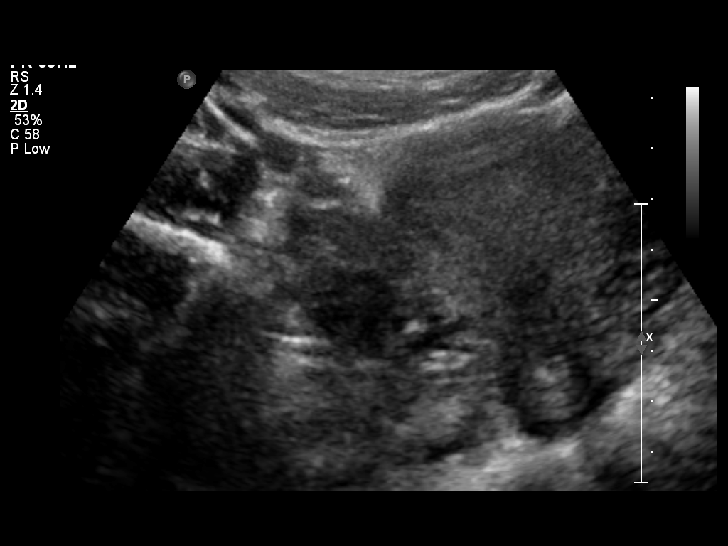
[im 12/45]
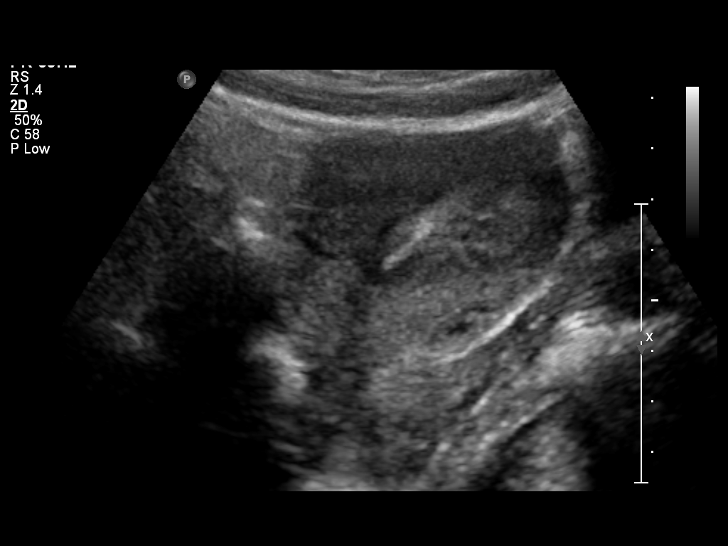
[im 15/45]
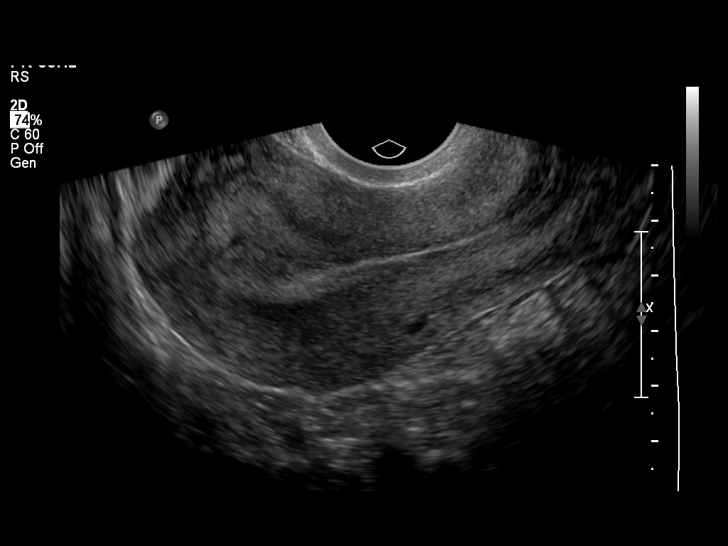
[im 18/45]
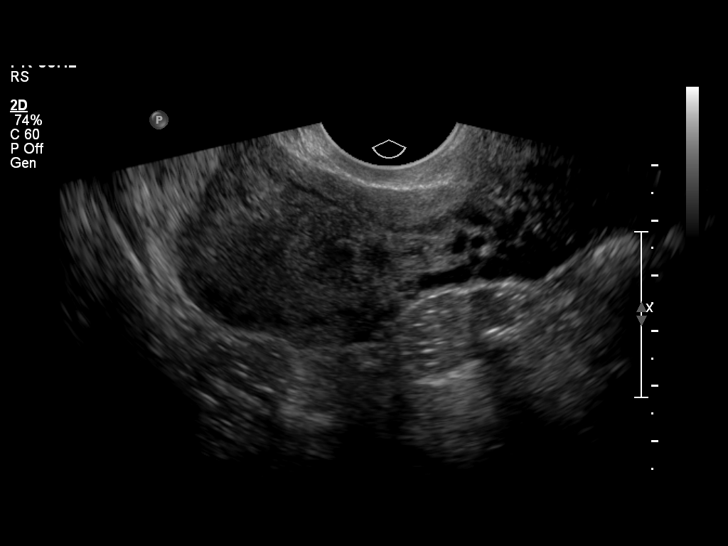
[im 23/45]
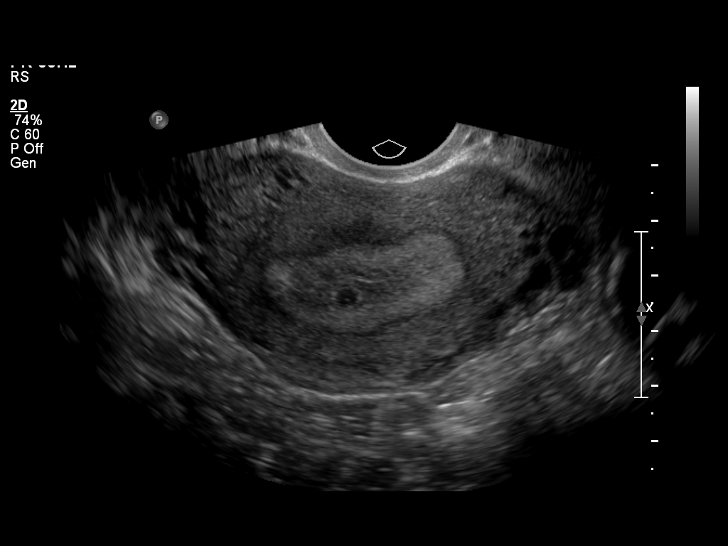
[im 27/45]
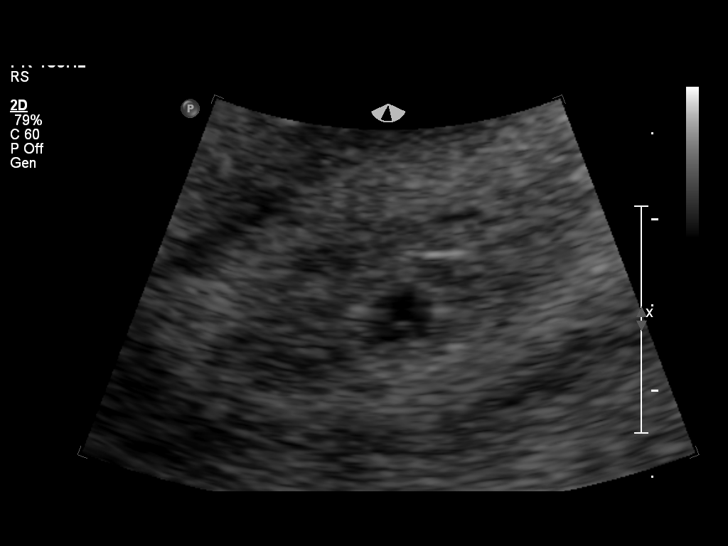
[im 30/45]
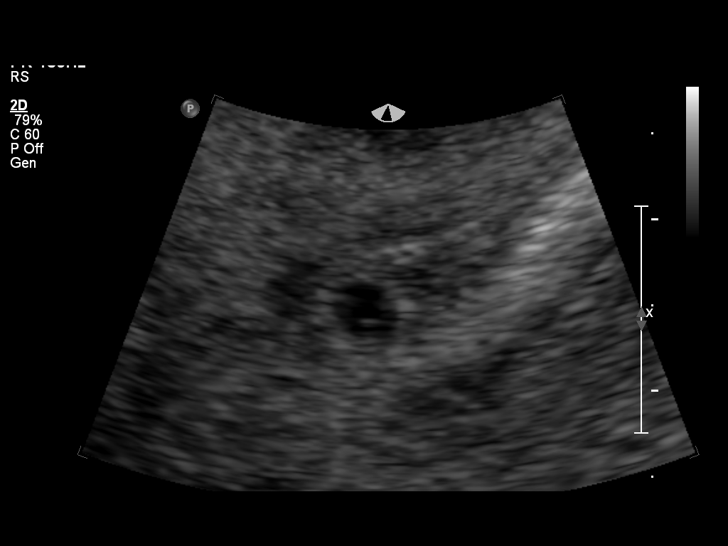
[im 33/45]
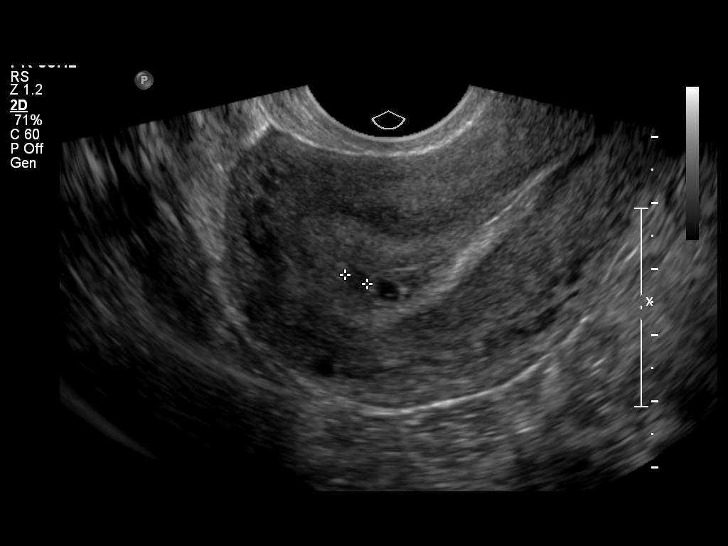
[im 36/45]
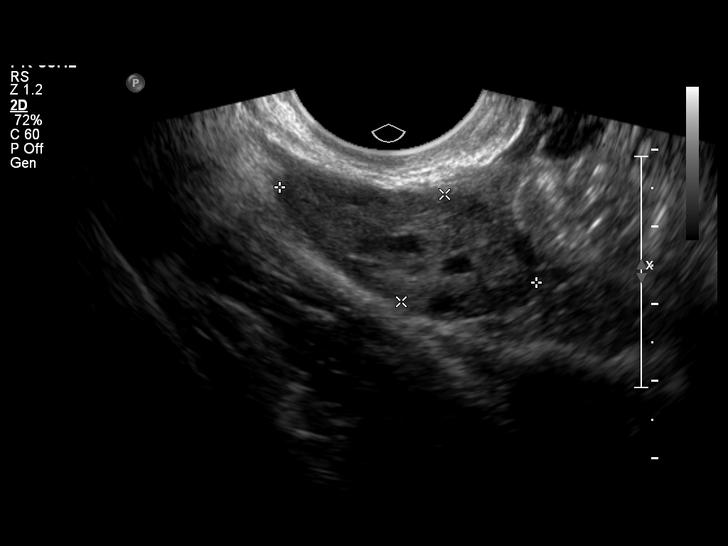
[im 40/45]
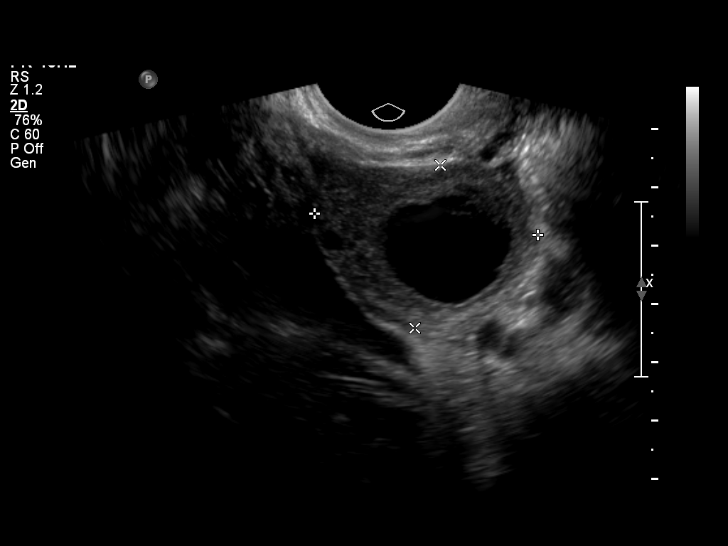
[im 43/45]
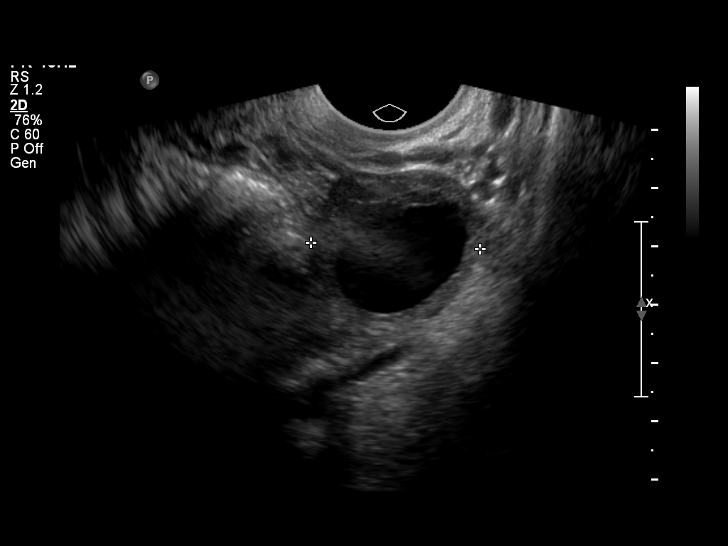

[13 of 28 positions shown; findings below may reference images not displayed]

Intrauterine gestational sac:  Visualized/normal in shape.
Yolk sac: Yes
Embryo: No
Cardiac Activity: N/A
Heart Rate: N/A

MSD: 3.4  mm  4    w 4    d         US EDC: 04/08/2011

Maternal uterus/adnexae:
A small amount of subchorionic hemorrhage is incidentally noted.
The uterus otherwise unremarkable in appearance.

The ovaries are normal in size.  The right ovary measures 3.6 x
x 2.3 cm, while the left ovary measures 3.9 x 2.8 x 2.9 cm.
Limited Doppler evaluation demonstrates normal color Doppler blood
flow with respect to the left ovary, with an associated corpus
luteal cyst.  No free fluid is seen in the pelvic cul-de-sac.
IMPRESSION: 1.  Single small intrauterine gestational sac noted, measuring
mm in diameter, corresponding to a gestational age of 4 weeks 4
days.  This matches the patient's gestational age by LMP, and
reflects an estimated date of delivery April 10, 2011.  The
gestational sac remains too small to characterize the embryo.
2.  Small amount of subchorionic hemorrhage incidentally noted.

## 2014-01-16 ENCOUNTER — Emergency Department (HOSPITAL_COMMUNITY)
Admission: EM | Admit: 2014-01-16 | Discharge: 2014-01-16 | Discharge status: Against Medical Advice | Payer: Medicaid Other | Attending: Emergency Medicine | Admitting: Emergency Medicine

## 2014-01-16 ENCOUNTER — Encounter (HOSPITAL_COMMUNITY): Payer: Self-pay | Admitting: Emergency Medicine

## 2014-01-16 DIAGNOSIS — N938 Other specified abnormal uterine and vaginal bleeding: Secondary | ICD-10-CM | POA: Insufficient documentation

## 2014-01-16 DIAGNOSIS — Z720 Tobacco use: Secondary | ICD-10-CM | POA: Insufficient documentation

## 2014-01-16 DIAGNOSIS — R55 Syncope and collapse: Secondary | ICD-10-CM | POA: Insufficient documentation

## 2014-01-16 DIAGNOSIS — R110 Nausea: Secondary | ICD-10-CM | POA: Insufficient documentation

## 2014-01-16 NOTE — ED Notes (Signed)
Pt states she is extremely dizzy when she stands up and somewhat dizzy when she is sitting  Pt states sxs started this morning  Pt states she has nausea if she tries to eat or drink anything

## 2014-01-17 ENCOUNTER — Encounter (HOSPITAL_COMMUNITY): Payer: Self-pay | Admitting: Emergency Medicine

## 2014-03-18 HISTORY — PX: COLONOSCOPY: SHX174

## 2014-05-17 HISTORY — PX: WISDOM TOOTH EXTRACTION: SHX21

## 2014-09-03 ENCOUNTER — Emergency Department (HOSPITAL_COMMUNITY): Payer: BLUE CROSS/BLUE SHIELD

## 2014-09-03 ENCOUNTER — Encounter (HOSPITAL_COMMUNITY): Payer: Self-pay | Admitting: Emergency Medicine

## 2014-09-03 ENCOUNTER — Emergency Department (HOSPITAL_COMMUNITY)
Admission: EM | Admit: 2014-09-03 | Discharge: 2014-09-03 | Disposition: A | Discharge status: Home / Self Care | Payer: BLUE CROSS/BLUE SHIELD | Attending: Emergency Medicine | Admitting: Emergency Medicine

## 2014-09-03 DIAGNOSIS — Z8719 Personal history of other diseases of the digestive system: Secondary | ICD-10-CM | POA: Insufficient documentation

## 2014-09-03 DIAGNOSIS — Z87891 Personal history of nicotine dependence: Secondary | ICD-10-CM | POA: Diagnosis not present

## 2014-09-03 DIAGNOSIS — Z3202 Encounter for pregnancy test, result negative: Secondary | ICD-10-CM | POA: Insufficient documentation

## 2014-09-03 DIAGNOSIS — G43509 Persistent migraine aura without cerebral infarction, not intractable, without status migrainosus: Secondary | ICD-10-CM | POA: Insufficient documentation

## 2014-09-03 DIAGNOSIS — R51 Headache: Secondary | ICD-10-CM

## 2014-09-03 DIAGNOSIS — R519 Headache, unspecified: Secondary | ICD-10-CM

## 2014-09-03 LAB — I-STAT BETA HCG BLOOD, ED (MC, WL, AP ONLY): I-stat hCG, quantitative: <5 m[IU]/mL (ref <5)

## 2014-09-03 MED ORDER — PROCHLORPERAZINE EDISYLATE 5 MG/ML IJ SOLN
10.0000 mg | Freq: Four times a day (QID) | INTRAMUSCULAR | Status: DC | PRN
  Administered 2014-09-03: 10 mg via INTRAVENOUS
  Filled 2014-09-03: qty 2

## 2014-09-03 MED ORDER — DIPHENHYDRAMINE HCL 50 MG/ML IJ SOLN
12.5000 mg | Freq: Once | INTRAMUSCULAR | Status: AC
  Administered 2014-09-03: 12.5 mg via INTRAVENOUS
  Filled 2014-09-03: qty 1

## 2014-09-03 MED ORDER — SODIUM CHLORIDE 0.9 % IV BOLUS (SEPSIS)
500.0000 mL | Freq: Once | INTRAVENOUS | Status: AC
  Administered 2014-09-03: 500 mL via INTRAVENOUS

## 2014-09-03 NOTE — ED Notes (Signed)
Pt states headache x2 weeks, increasing into unbearable pain. States starting in the back of her head, pressure like pain, more on the right side than the left. Nausea, denies vomiting, diarrhea, fever, chills. Sensitivity to light, states extremely dizzy, unable to concentrate at work. Family history of migraines

## 2014-09-03 NOTE — ED Provider Notes (Signed)
CSN: [id number]     Arrival date & time 09/03/14  1207 History   First MD Initiated Contact with Patient 09/03/14 1216     Chief Complaint  Patient presents with  . Headache  . Nausea      HPI Pt states headache x2 weeks, increasing into unbearable pain. States starting in the back of her head, pressure like pain, more on the right side than the left. Nausea, denies vomiting, diarrhea, fever, chills. Sensitivity to light, states extremely dizzy, unable to concentrate at work. Family history of migraines Past Medical History  Diagnosis Date  . GERD (gastroesophageal reflux disease)   . Colitis, chronic, ulcerative    Past Surgical History  Procedure Laterality Date  . Colonoscopy    . Removal of iud    . No past surgeries    . Wisdom tooth extraction     Family History  Problem Relation Age of Onset  . Cancer Other    History  Substance Use Topics  . Smoking status: Former Smoker    Types: Cigarettes  . Smokeless tobacco: Not on file  . Alcohol Use: No   OB History    Gravida Para Term Preterm AB TAB SAB Ectopic Multiple Living   Review of Systems  All other systems reviewed and are negative  Allergies  Review of patient's allergies indicates no known allergies.  Home Medications   Prior to Admission medications   Medication Sig Start Date End Date Taking? Authorizing Provider  acetaminophen (TYLENOL) 500 MG tablet Take 1,000 mg by mouth every 6 (six) hours as needed. Pain   Yes Historical Provider, MD  doxylamine, Sleep, (UNISOM) 25 MG tablet Take 100 mg by mouth at bedtime as needed for sleep.   Yes Historical Provider, MD  Tetrahydrozoline HCl (VISINE OP) Apply 1-2 drops to eye daily as needed (dry eyes).   Yes Historical Provider, MD  cyclobenzaprine (FLEXERIL) 10 MG tablet Take 1 tablet (10 mg total) by mouth 2 (two) times daily as needed for muscle spasms. 09/10/14   Danelle Berry, PA-C  HYDROcodone-homatropine (HYCODAN) 5-1.5 MG/5ML syrup  Take 5 mLs by mouth every 6 (six) hours as needed for cough. Patient not taking: Reported on 09/03/2014 02/22/12   Jaynie Crumble, PA-C  meclizine (ANTIVERT) 25 MG tablet Take 1 tablet (25 mg total) by mouth 2 (two) times daily as needed for dizziness. 09/10/14   Danelle Berry, PA-C  methocarbamol (ROBAXIN) 500 MG tablet Take 1 tablet (500 mg total) by mouth 4 (four) times daily. 09/05/14   Garnetta Buddy, PA  traMADol (ULTRAM) 50 MG tablet Take 1 tablet (50 mg total) by mouth every 8 (eight) hours as needed. 09/05/14   Collie Siad English, PA   BP 103/53 mmHg  Pulse 81  Temp(Src) 97.9 F (36.6 C) (Oral)  Resp 17  SpO2 98%  LMP  Physical Exam  Constitutional: She is oriented to person, place, and time. She appears well-developed and well-nourished. No distress.  HENT:  Head: Normocephalic and atraumatic.  Eyes: Pupils are equal, round, and reactive to light.  Neck: Normal range of motion.  Cardiovascular: Normal rate and intact distal pulses.   Pulmonary/Chest: No respiratory distress.  Abdominal: Normal appearance. She exhibits no distension.  Musculoskeletal: Normal range of motion.  Neurological: She is alert and oriented to person, place, and time. No cranial nerve deficit or sensory deficit. GCS eye subscore is 4. GCS verbal subscore  is 5. GCS motor subscore is 6.  Skin: Skin is warm and dry. No rash noted.  Psychiatric: She has a normal mood and affect. Her behavior is normal.  Nursing note and vitals reviewed.   ED Course  Procedures (including critical care time) Medications  sodium chloride 0.9 % bolus 500 mL (0 mLs Intravenous Stopped 09/03/14 1331)  diphenhydrAMINE (BENADRYL) injection 12.5 mg (12.5 mg Intravenous Given 09/03/14 1242)    Labs Review Labs Reviewed  I-STAT BETA HCG BLOOD, ED ([facility], [facility], [facility] ONLY)    Imaging Review      CT Head Wo Contrast (Final result) Result time: 09/03/14 13:13:21   Final result by Rad Results In Interface (09/03/14 13:13:21)    Narrative:   CLINICAL DATA: Headache for 2 weeks.  EXAM: CT HEAD WITHOUT CONTRAST  TECHNIQUE: Contiguous axial images were obtained from the base of the skull through the vertex without intravenous contrast.  COMPARISON: None.  FINDINGS: No mass effect, midline shift, or acute intracranial hemorrhage. Brain parenchyma and ventricular system are unremarkable. Cranium is intact. Mastoid air cells and visualized paranasal sinuses are clear.  IMPRESSION: Negative head CT.   Electronically Signed By: Jolaine Click M.D. 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

## 2014-09-03 NOTE — Discharge Instructions (Signed)

## 2014-09-03 NOTE — ED Notes (Signed)
MD at bedside. Dr. Beaton at bedside.  

## 2014-09-05 ENCOUNTER — Ambulatory Visit (INDEPENDENT_AMBULATORY_CARE_PROVIDER_SITE_OTHER): Payer: BLUE CROSS/BLUE SHIELD | Admitting: Physician Assistant

## 2014-09-05 VITALS — BP 100/66 | HR 91 | Temp 98.7°F | Resp 18 | Ht 65.0 in | Wt 183.0 lb

## 2014-09-05 DIAGNOSIS — G43811 Other migraine, intractable, with status migrainosus: Secondary | ICD-10-CM | POA: Diagnosis not present

## 2014-09-05 DIAGNOSIS — R51 Headache: Secondary | ICD-10-CM | POA: Diagnosis not present

## 2014-09-05 DIAGNOSIS — R519 Headache, unspecified: Secondary | ICD-10-CM

## 2014-09-05 LAB — POCT CBC
Granulocyte percent: 63.4 %G (ref 37–80)
HCT, POC: 42.9 % (ref 37.7–47.9)
HEMOGLOBIN: 14 g/dL (ref 12.2–16.2)
LYMPH, POC: 2.1 (ref 0.6–3.4)
MCH: 29.4 pg (ref 27–31.2)
MCHC: 32.6 g/dL (ref 31.8–35.4)
MCV: 90.2 fL (ref 80–97)
MID (CBC): 0.3 (ref 0–0.9)
MPV: 6.7 fL (ref 0–99.8)
PLATELET COUNT, POC: 335 10*3/uL (ref 142–424)
POC Granulocyte: 4.2 (ref 2–6.9)
POC LYMPH %: 31.7 % (ref 10–50)
POC MID %: 4.9 % (ref 0–12)
RBC: 4.75 M/uL (ref 4.04–5.48)
RDW, POC: 13.5 %
WBC: 6.7 10*3/uL (ref 4.6–10.2)

## 2014-09-05 LAB — POCT SEDIMENTATION RATE: POCT SED RATE: 11 mm/hr (ref 0–22)

## 2014-09-05 MED ORDER — KETOROLAC TROMETHAMINE 60 MG/2ML IM SOLN
60.0000 mg | Freq: Once | INTRAMUSCULAR | Status: AC
  Administered 2014-09-05: 60 mg via INTRAMUSCULAR

## 2014-09-05 MED ORDER — TRAMADOL HCL 50 MG PO TABS: 50.0000 mg | ORAL_TABLET | Freq: Three times a day (TID) | ORAL | Status: DC | PRN

## 2014-09-05 MED ORDER — METHOCARBAMOL 500 MG PO TABS: 500.0000 mg | ORAL_TABLET | Freq: Four times a day (QID) | ORAL | Status: DC

## 2014-09-05 NOTE — Progress Notes (Signed)
[facility] [address], [place] [address] [phone]  Date:  09/05/2014   Name:  Robin Aguirre   DOB:  [birth year]   MRN:  [id number]  PCP:  PROVIDER NOT IN SYSTEM    History of Present Illness:  Robin Aguirre is a 24 y.o. female patient who presents to [facility] with chief complaint of head pain that has lasted for 3 weeks.  She states that it is all over from the base of her head to the ground. It is constant pulsating headache that leaves her dizzy and sometimes nauseous. She has neck pain along the back of her neck, and states that it is stiff.  She states that light worsens her symptoms as well as loud noise. She has had no vomiting or syncope. She states that she almost crashed her car today. Her pain is constant so she does not claim an aura, however she has been seeing green spots. She has been saying some dark green spots when she looks.  She was seen at the ED 2 days ago and was treated with an injection of Compazine and Benadryl and IV fluids.  This did not relieve her headache but did make her sleep. She has tried multiple headache medications including Tylenol, Advil, Bayer, Aleve, Excedrin, and Unisom. None have "touched" "the headache. The Unisom made her sleepy. She has had no fever. She denies any tinnitus, but did have some postauricular ear pain which was what caused her concern to go to the ED.  She has never had a headache like this before.    Her last. Was 3 weeks ago. She has an Implanon as a source of her birth control.   There are no active problems to display for this patient.   Past Medical History  Diagnosis Date  . GERD (gastroesophageal reflux disease)   . Colitis, chronic, ulcerative     Past Surgical History  Procedure Laterality Date  . Colonoscopy    . Removal of iud    . No past surgeries    . Wisdom tooth extraction      History  Substance Use Topics  . Smoking status: Former Smoker    Types: Cigarettes  . Smokeless  tobacco: Not on file  . Alcohol Use: No    Family History  Problem Relation Age of Onset  . Cancer Other     No Known Allergies  Medication list has been reviewed and updated.  Current Outpatient Prescriptions on File Prior to Visit  Medication Sig Dispense Refill  . acetaminophen (TYLENOL) 500 MG tablet Take 1,000 mg by mouth every 6 (six) hours as needed. Pain    . doxylamine, Sleep, (UNISOM) 25 MG tablet Take 100 mg by mouth at bedtime as needed for sleep.    Marland Kitchen HYDROcodone-homatropine (HYCODAN) 5-1.5 MG/5ML syrup Take 5 mLs by mouth every 6 (six) hours as needed for cough. (Patient not taking: Reported on 09/03/2014) 120 mL 0  . Tetrahydrozoline HCl (VISINE OP) Apply 1-2 drops to eye daily as needed (dry eyes).    . [DISCONTINUED] sucralfate (CARAFATE) 1 GM/10ML suspension Take 10 mLs (1 g total) by mouth 4 (four) times daily. 420 mL 0   No current facility-administered medications on file prior to visit.    ROS ROS otherwise unremarkable unless listed above.    Physical Examination: BP 98/76 mmHg  Pulse 98  Temp(Src) 98.7 F (37.1 C)  Resp 18  Ht  (1.651 m)  Wt  183 lb (83.008 kg)  BMI 30.45 kg/m2  SpO2 99%  LMP 08/15/2014 Ideal Body Weight: Weight in (lb) to have BMI = 25: 149.9  Physical Exam Alert cooperative and oriented 4. PERRLA with normal conjunctiva.  Normal ocular movement, without nystagmus. There is no photophobia appreciated with eye exam. External ear, canal, and tympanic membrane are normal bilaterally. There is no sign of mastoiditis. No meningeal signs though there is pain with cervical range of motion in all planes.  No lymphadenopathy.  Heart rate with regular rate and rhythm without murmurs, rubs, gallops. Lungs sounds are normal without wheezing or rhonchi.  Normal radial and dorsalis pedis pulse.  Appropriate ocular movement, normal finger to nose, normal rapid movement in upper and lower extremity. Negative Romberg.  Results for orders placed  or performed in visit on 09/05/14  POCT CBC  Result Value Ref Range   WBC 6.7 4.6 - 10.2 K/uL   Lymph, poc 2.1 0.6 - 3.4   POC LYMPH PERCENT 31.7 10 - 50 %L   MID (cbc) 0.3 0 - 0.9   POC MID % 4.9 0 - 12 %M   POC Granulocyte 4.2 2 - 6.9   Granulocyte percent 63.4 37 - 80 %G   RBC 4.75 4.04 - 5.48 M/uL   Hemoglobin 14.0 12.2 - 16.2 g/dL   HCT, POC 52.8 41.3 - 47.9 %   MCV 90.2 80 - 97 fL   MCH, POC 29.4 27 - 31.2 pg   MCHC 32.6 31.8 - 35.4 g/dL   RDW, POC 24.4 %   Platelet Count, POC 335 142 - 424 K/uL   MPV 6.7 0 - 99.8 fL   Toradol given without improvement.  Results for orders placed or performed in visit on 09/05/14  POCT CBC  Result Value Ref Range   WBC 6.7 4.6 - 10.2 K/uL   Lymph, poc 2.1 0.6 - 3.4   POC LYMPH PERCENT 31.7 10 - 50 %L   MID (cbc) 0.3 0 - 0.9   POC MID % 4.9 0 - 12 %M   POC Granulocyte 4.2 2 - 6.9   Granulocyte percent 63.4 37 - 80 %G   RBC 4.75 4.04 - 5.48 M/uL   Hemoglobin 14.0 12.2 - 16.2 g/dL   HCT, POC 01.0 27.2 - 47.9 %   MCV 90.2 80 - 97 fL   MCH, POC 29.4 27 - 31.2 pg   MCHC 32.6 31.8 - 35.4 g/dL   RDW, POC 53.6 %   Platelet Count, POC 335 142 - 424 K/uL   MPV 6.7 0 - 99.8 fL  POCT SEDIMENTATION RATE  Result Value Ref Range   POCT SED RATE 11 0 - 22 mm/hr    Assessment and Plan:: 60 mg of Toradol given without improved response.  She has had a CT 2 days ago which was negative. We will attempt to break this cycle with tramadol q 6 hrs.  Advised to start robaxin muscle relaxant to see if this may be the culprit of this head pain.  If sxs worsen, she has been instructed to return to the clinic or the ED, where a referral to neuro, and MRI is warranted.     Other migraine with status migrainosus, intractable - Plan: POCT CBC, ketorolac (TORADOL) injection 60 mg, POCT SEDIMENTATION RATE  Intractable headache, unspecified chronicity pattern, unspecified headache type - Plan: methocarbamol (ROBAXIN) 500 MG tablet, traMADol (ULTRAM) 50 MG  tablet    Trena Platt, PA-C [facility] 09/05/2014 4:52 PM

## 2014-09-05 NOTE — Patient Instructions (Signed)
Please take medicaiton as prescribed.  If your headache/symptoms worsens, you need to return to [place] or go to the ER. Increase your water intake to 64oz per day.  This is about 4 regular sized water bottles.    General Headache Without Cause A general headache is pain or discomfort felt around the head or neck area. The cause may not be found.  HOME CARE   Keep all doctor visits.  Only take medicines as told by your doctor.  Lie down in a dark, quiet room when you have a headache.  Keep a journal to find out if certain things bring on headaches. For example, write down:  What you eat and drink.  How much sleep you get.  Any change to your diet or medicines.  Relax by getting a massage or doing other relaxing activities.  Put ice or heat packs on the head and neck area as told by your doctor.  Lessen stress.  Sit up straight. Do not tighten (tense) your muscles.  Quit smoking if you smoke.  Lessen how much alcohol you drink.  Lessen how much caffeine you drink, or stop drinking caffeine.  Eat and sleep on a regular schedule.  Get 7 to 9 hours of sleep, or as told by your doctor.  Keep lights dim if bright lights bother you or make your headaches worse. GET HELP RIGHT AWAY IF:   Your headache becomes really bad.  You have a fever.  You have a stiff neck.  You have trouble seeing.  Your muscles are weak, or you lose muscle control.  You lose your balance or have trouble walking.  You feel like you will pass out (faint), or you pass out.  You have really bad symptoms that are different than your first symptoms.  You have problems with the medicines given to you by your doctor.  Your medicines do not work.  Your headache feels different than the other headaches.  You feel sick to your stomach (nauseous) or throw up (vomit). MAKE SURE YOU:   Understand these instructions.  Will watch your condition.  Will get help right away if you are not doing well or  get worse. Document Released: 12/12/2007 Document Revised: 05/27/2011 Document Reviewed: 02/22/2011 [facility] Patient Information 2015 [place], [place]. This information is not intended to replace advice given to you by your health care provider. Make sure you discuss any questions you have with your health care provider.

## 2014-09-05 NOTE — Progress Notes (Signed)
Discussed patient with Trena Platt, PA-C. The patient has been having a headache for 3 weeks. CT scan in the emergency room was normal 2 days ago. She continues to hurt badly in her whole head. She complained of her neck hurting also. I examined the patient briefly. The neck is not rigid in any fashion. Labs were normal. It is my impression was treat with pain medication and muscle relaxant and see if we can break the cycle. She is to return if she continues to have bad headaches.

## 2014-09-08 ENCOUNTER — Telehealth: Payer: Self-pay

## 2014-09-08 NOTE — Telephone Encounter (Signed)
Assessment and Plan:: 60 mg of Toradol given without improved response. She has had a CT 2 days ago which was negative. We will attempt to break this cycle with tramadol q 6 hrs. Advised to start robaxin muscle relaxant to see if this may be the culprit of this head pain. If sxs worsen, she has been instructed to return to the clinic or the ED, where a referral to neuro, and MRI is warranted.   Other migraine with status migrainosus, intractable - Plan: POCT CBC, ketorolac (TORADOL) injection 60 mg, POCT SEDIMENTATION RATE  Intractable headache, unspecified chronicity pattern, unspecified headache type - Plan: methocarbamol (ROBAXIN) 500 MG tablet, traMADol (ULTRAM) 50 MG tablet    Trena Platt, PA-C [facility] 09/05/2014 4:52 PM  Should we schedule MRI first or neuro?

## 2014-09-08 NOTE — Telephone Encounter (Signed)
Patient isn't feeling better and was advised to call back to see if she needs a referral or another office visit. Please [phone]

## 2014-09-09 ENCOUNTER — Telehealth: Payer: Self-pay

## 2014-09-09 ENCOUNTER — Encounter (HOSPITAL_COMMUNITY): Payer: Self-pay | Admitting: Emergency Medicine

## 2014-09-09 ENCOUNTER — Emergency Department (HOSPITAL_COMMUNITY)
Admission: EM | Admit: 2014-09-09 | Discharge: 2014-09-10 | Disposition: A | Discharge status: Home / Self Care | Payer: BLUE CROSS/BLUE SHIELD | Attending: Emergency Medicine | Admitting: Emergency Medicine

## 2014-09-09 DIAGNOSIS — M542 Cervicalgia: Secondary | ICD-10-CM | POA: Diagnosis not present

## 2014-09-09 DIAGNOSIS — Z8719 Personal history of other diseases of the digestive system: Secondary | ICD-10-CM | POA: Insufficient documentation

## 2014-09-09 DIAGNOSIS — R51 Headache: Secondary | ICD-10-CM | POA: Insufficient documentation

## 2014-09-09 DIAGNOSIS — G8929 Other chronic pain: Secondary | ICD-10-CM

## 2014-09-09 DIAGNOSIS — R42 Dizziness and giddiness: Secondary | ICD-10-CM | POA: Insufficient documentation

## 2014-09-09 DIAGNOSIS — R519 Headache, unspecified: Secondary | ICD-10-CM

## 2014-09-09 MED ORDER — IBUPROFEN 200 MG PO TABS
400.0000 mg | ORAL_TABLET | Freq: Once | ORAL | Status: AC
  Administered 2014-09-09: 400 mg via ORAL
  Filled 2014-09-09: qty 2

## 2014-09-09 MED ORDER — SODIUM CHLORIDE 0.9 % IV BOLUS (SEPSIS)
500.0000 mL | Freq: Once | INTRAVENOUS | Status: AC
  Administered 2014-09-10: 500 mL via INTRAVENOUS

## 2014-09-09 NOTE — ED Notes (Signed)
Pt reports pounding and pressure pain starting where her neck meets her shoulder radiating to behind her eyeballs.

## 2014-09-09 NOTE — ED Notes (Addendum)
Pt from home c/o headache x 4 weeks. She reports being seen on 6/20 for same  Without relief. She states taking tramadol and OTC meds without relief. She states "I don't think it's a migraine". Denies nausea. She denies taking any meds today to relieve pain.

## 2014-09-09 NOTE — Telephone Encounter (Signed)
Received pt FMLA ppw via fax on 09/09/14. Patient was seen by Garnetta Buddy, PA at 09/05/2014 4:52 PM for Other migraine with status migrainosus, intractable. Completed portions of ppw based on OV notes, will place in Stephanie's box for completion in 5-7 business days. Once complete please return to disability department. Jasmine or myself will scan into release,currently placed on hold, and fax to [phone].  Patient has not yet paid the $15   Thank you,  Lauren

## 2014-09-09 NOTE — ED Provider Notes (Deleted)
CSN: [id number]     Arrival date & time 09/09/14  2001 History   First MD Initiated Contact with Patient 09/09/14 2335     Chief Complaint  Patient presents with  . Headache     (Consider location/radiation/quality/duration/timing/severity/associated sxs/prior Treatment) HPI  Past Medical History  Diagnosis Date  . GERD (gastroesophageal reflux disease)   . Colitis, chronic, ulcerative    Past Surgical History  Procedure Laterality Date  . Colonoscopy    . Removal of iud    . No past surgeries    . Wisdom tooth extraction     Family History  Problem Relation Age of Onset  . Cancer Other    History  Substance Use Topics  . Smoking status: Former Smoker    Types: Cigarettes  . Smokeless tobacco: Not on file  . Alcohol Use: No   OB History    Gravida Para Term Preterm AB TAB SAB Ectopic Multiple Living   2 2 2       2      Review of Systems    Allergies  Review of patient's allergies indicates no known allergies.  Home Medications   Prior to Admission medications   Medication Sig Start Date End Date Taking? Authorizing Provider  acetaminophen (TYLENOL) 500 MG tablet Take 1,000 mg by mouth every 6 (six) hours as needed. Pain   Yes Historical Provider, MD  doxylamine, Sleep, (UNISOM) 25 MG tablet Take 100 mg by mouth at bedtime as needed for sleep.   Yes Historical Provider, MD  methocarbamol (ROBAXIN) 500 MG tablet Take 1 tablet (500 mg total) by mouth 4 (four) times daily. 09/05/14  Yes Collie Siad English, PA  Tetrahydrozoline HCl (VISINE OP) Apply 1-2 drops to eye daily as needed (dry eyes).   Yes Historical Provider, MD  traMADol (ULTRAM) 50 MG tablet Take 1 tablet (50 mg total) by mouth every 8 (eight) hours as needed. 09/05/14  Yes Stephanie D English, PA  HYDROcodone-homatropine (HYCODAN) 5-1.5 MG/5ML syrup Take 5 mLs by mouth every 6 (six) hours as needed for cough. Patient not taking: Reported on 09/03/2014 02/22/12   Tatyana Kirichenko, PA-C   BP 100/69  mmHg  Pulse 99  Temp(Src) 97.8 F (36.6 C) (Oral)  Resp 18  SpO2 98%  LMP 08/15/2014 Physical Exam  ED Course  Procedures (including critical care time) Labs Review Labs Reviewed - No data to display  Imaging Review No results found.   EKG Interpretation None      MDM   Final diagnoses:  None       Danelle Berry, PA-C 09/09/14 2357

## 2014-09-10 MED ORDER — CYCLOBENZAPRINE HCL 10 MG PO TABS: 10.0000 mg | ORAL_TABLET | Freq: Two times a day (BID) | ORAL | Status: DC | PRN

## 2014-09-10 MED ORDER — BUPIVACAINE HCL (PF) 0.5 % IJ SOLN
10.0000 mL | Freq: Once | INTRAMUSCULAR | Status: DC
  Filled 2014-09-10: qty 30

## 2014-09-10 MED ORDER — MECLIZINE HCL 25 MG PO TABS: 25.0000 mg | ORAL_TABLET | Freq: Two times a day (BID) | ORAL | Status: DC | PRN

## 2014-09-10 MED ORDER — IBUPROFEN 800 MG PO TABS: 800.0000 mg | ORAL_TABLET | Freq: Three times a day (TID) | ORAL | Status: DC

## 2014-09-10 MED ORDER — METOCLOPRAMIDE HCL 5 MG/ML IJ SOLN
10.0000 mg | Freq: Once | INTRAMUSCULAR | Status: AC
  Administered 2014-09-10: 10 mg via INTRAVENOUS
  Filled 2014-09-10: qty 2

## 2014-09-10 MED ORDER — HYDROCODONE-ACETAMINOPHEN 5-325 MG PO TABS: 2.0000 | ORAL_TABLET | ORAL | Status: DC | PRN

## 2014-09-10 MED ORDER — KETOROLAC TROMETHAMINE 30 MG/ML IJ SOLN
30.0000 mg | Freq: Once | INTRAMUSCULAR | Status: AC
  Administered 2014-09-10: 30 mg via INTRAVENOUS
  Filled 2014-09-10: qty 1

## 2014-09-10 MED ORDER — DEXAMETHASONE SODIUM PHOSPHATE 10 MG/ML IJ SOLN
10.0000 mg | Freq: Once | INTRAMUSCULAR | Status: AC
  Administered 2014-09-10: 10 mg via INTRAVENOUS
  Filled 2014-09-10: qty 1

## 2014-09-10 MED ORDER — DIPHENHYDRAMINE HCL 50 MG/ML IJ SOLN: 25.0000 mg | Freq: Once | INTRAMUSCULAR | Status: DC

## 2014-09-10 MED ORDER — MECLIZINE HCL 25 MG PO TABS
25.0000 mg | ORAL_TABLET | Freq: Once | ORAL | Status: AC
  Administered 2014-09-10: 25 mg via ORAL
  Filled 2014-09-10: qty 1

## 2014-09-10 NOTE — ED Provider Notes (Signed)
CSN: [id number]     Arrival date & time 09/09/14  2001 History   First MD Initiated Contact with Patient 09/09/14 2335     Chief Complaint  Patient presents with  . Headache     (Consider location/radiation/quality/duration/timing/severity/associated sxs/prior Treatment) HPI   Patient is a 24 year old female otherwise healthy with 3 week history of a headache located and the back of her head and wrapping around her ears and also down her neck. She is seen in the ER on 09/03/2014, and at that time had a negative head CT, was given Compazine and Benadryl, which she said made her fall asleep and never took away the pain. She currently rates it 8 out of 10, with a throbbing quality, located at the back of her head and wrapping around both her ears, and associated photophobia without visual changes, tinnitus, sensation of loss of balance when she is upright or trying to walk, which leads to nausea.  She has some muscle tension and discomfort in the back of her neck and head, which make the headache worse.  She was seen in urgent care on 09/05/2014, and was given Ultram and Robaxin.  The ultram would give her some temporary relief for the first couple days, but she states is no longer working.  She called the family medicine urgent care, as instructed, and they told her to come back to the ER.   She denies any fever, chills, vomiting.  She denies any ear pain, sore throat, nasal congestion or facial pain or pressure.  She denies any numbness or weakness.   Past Medical History  Diagnosis Date  . GERD (gastroesophageal reflux disease)   . Colitis, chronic, ulcerative    Past Surgical History  Procedure Laterality Date  . Colonoscopy    . Removal of iud    . No past surgeries    . Wisdom tooth extraction     Family History  Problem Relation Age of Onset  . Cancer Other    History  Substance Use Topics  . Smoking status: Former Smoker    Types: Cigarettes  . Smokeless tobacco: Not on file    . Alcohol Use: No   OB History    Gravida Para Term Preterm AB TAB SAB Ectopic Multiple Living   Review of Systems  Constitutional: Negative for fever, chills, diaphoresis and fatigue.  HENT: Negative for congestion, dental problem, ear pain, postnasal drip, rhinorrhea, sinus pressure and sore throat.   Eyes: Negative for visual disturbance.  Respiratory: Negative for cough and shortness of breath.   Cardiovascular: Negative for chest pain and palpitations.  Gastrointestinal: Negative for vomiting, abdominal pain and diarrhea.  Musculoskeletal: Positive for neck pain. Negative for back pain, joint swelling, arthralgias and gait problem.  Skin: Negative.   Neurological: Positive for dizziness and light-headedness. Negative for tremors, seizures, syncope, facial asymmetry, speech difficulty, weakness and numbness.  Psychiatric/Behavioral: Negative.     Allergies  Review of patient's allergies indicates no known allergies.  Home Medications   Prior to Admission medications   Medication Sig Start Date End Date Taking? Authorizing Provider  acetaminophen (TYLENOL) 500 MG tablet Take 1,000 mg by mouth every 6 (six) hours as needed. Pain   Yes Historical Provider, MD  doxylamine, Sleep, (UNISOM) 25 MG tablet Take 100 mg by mouth at bedtime as needed for sleep.   Yes Historical Provider, MD  methocarbamol (ROBAXIN) 500 MG tablet Take  1 tablet (500 mg total) by mouth 4 (four) times daily. 09/05/14  Yes Collie Siad English, PA  Tetrahydrozoline HCl (VISINE OP) Apply 1-2 drops to eye daily as needed (dry eyes).   Yes Historical Provider, MD  traMADol (ULTRAM) 50 MG tablet Take 1 tablet (50 mg total) by mouth every 8 (eight) hours as needed. 09/05/14  Yes Stephanie D English, PA  HYDROcodone-homatropine (HYCODAN) 5-1.5 MG/5ML syrup Take 5 mLs by mouth every 6 (six) hours as needed for cough. Patient not taking: Reported on 09/03/2014 02/22/12   Tatyana Kirichenko, PA-C   BP  102/68 mmHg  Pulse 82  Temp(Src) 97.8 F (36.6 C) (Oral)  Resp 18  SpO2 100%  LMP 08/15/2014 Physical Exam  Constitutional: She is oriented to person, place, and time. Vital signs are normal. She appears well-developed and well-nourished. She is cooperative.  Non-toxic appearance. She does not have a sickly appearance. She does not appear ill.  Pt appears uncomfortable, laying in bed in the dark with eyes covered, non-toxic appearing  HENT:  Head: Normocephalic and atraumatic. Head is without right periorbital erythema and without left periorbital erythema.  Right Ear: Hearing, external ear and ear canal normal. No drainage, swelling or tenderness. No mastoid tenderness. No middle ear effusion.  Left Ear: Hearing, external ear and ear canal normal. No drainage, swelling or tenderness. No mastoid tenderness.  No middle ear effusion.  Nose: Nose normal. No mucosal edema, rhinorrhea or sinus tenderness. Right sinus exhibits no maxillary sinus tenderness and no frontal sinus tenderness. Left sinus exhibits no maxillary sinus tenderness and no frontal sinus tenderness.  Mouth/Throat: Uvula is midline, oropharynx is clear and moist and mucous membranes are normal. Mucous membranes are not pale, not dry and not cyanotic. No oral lesions. No trismus in the jaw. No oropharyngeal exudate, posterior oropharyngeal edema, posterior oropharyngeal erythema or tonsillar abscesses.  TM's bilaterally opaque - white to gray, umbo and manubrium visualized, loss of cone of light, no erythema or injection, TM not bulging, retracted, perforated  Eyes: Conjunctivae, EOM and lids are normal. Pupils are equal, round, and reactive to light. Right eye exhibits no chemosis and no discharge. Left eye exhibits no chemosis and no discharge. Right conjunctiva is not injected. Left conjunctiva is not injected. No scleral icterus.  Fundoscopic exam:      The right eye shows no arteriolar narrowing, no AV nicking, no exudate and no  hemorrhage. The right eye shows red reflex.       The left eye shows no arteriolar narrowing, no AV nicking, no exudate and no hemorrhage. The left eye shows red reflex.  Pupils 2-31mm, round, equal Fundoscopic: unable to visualize optic disc  Neck: Normal range of motion and phonation normal. Neck supple. No JVD present. Muscular tenderness present. No tracheal tenderness and no spinous process tenderness present. No rigidity. No tracheal deviation, no edema, no erythema and normal range of motion present. No Brudzinski's sign and no Kernig's sign noted. No thyroid mass and no thyromegaly present.    Cardiovascular: Normal rate, regular rhythm, normal heart sounds and intact distal pulses.  Exam reveals no gallop and no friction rub.   No murmur heard. Pulmonary/Chest: Effort normal and breath sounds normal. No accessory muscle usage or stridor. No tachypnea. No respiratory distress. She has no decreased breath sounds. She has no wheezes. She has no rhonchi. She has no rales. She exhibits no tenderness.  Abdominal: Soft. Bowel sounds are normal. She exhibits no distension and no mass. There is no  tenderness. There is no rebound and no guarding.  Musculoskeletal: Normal range of motion. She exhibits no edema or tenderness.  Lymphadenopathy:    She has no cervical adenopathy.  Neurological: She is alert and oriented to person, place, and time. She has normal reflexes. She is not disoriented. She displays no atrophy and no tremor. No cranial nerve deficit or sensory deficit. She exhibits normal muscle tone. She displays no seizure activity. Coordination normal.  Speech is clear and goal oriented, follows commands CN 2-12 without deficit, no facial droop Normal strength in upper and lower extremities bilaterally including dorsiflexion and plantar flexion, strong and equal grip strength Sensation normal to light and sharp touch Moves extremities without ataxia, coordination intact Normal finger to  nose and rapid alternating movements Neg romberg, no pronator drift Normal gait and balance   Skin: Skin is warm and dry. No rash noted. She is not diaphoretic. No cyanosis or erythema. No pallor. Nails show no clubbing.  Psychiatric: She has a normal mood and affect. Her behavior is normal. Judgment and thought content normal.  Nursing note and vitals reviewed.      ED Course  Procedures (including critical care time) Labs Review Labs Reviewed - No data to display  Imaging Review No results found.   EKG Interpretation None      MDM   Final diagnoses:  None    Headache for 3 weeks - vertigo sx with nausea No focal neurological deficits, head ct 09/03/14 negative Will treat vertigo with antivert, add headache cocktail - reglan, toradol, decadron Unable to visualize optic disc with multiple tries, pt unable to tolerate, no fundoscopic abnormalities visualized otherwise - suggest referral to neuro for follow-up work up and treatment. Dr. Norlene Campbell did a cervical injection -  Pt able to sit up in bed and open eyes, feels much better.  We have given referrals to both Neuro groups, and information for the [facility].  Pt was in agreement with plan to discharge and set up neuro follow-up at who ever could see her first.  Pt's vitals were reviewed, she was stable, afebrile, not tachy, was discharged home with a family member.    Danelle Berry, PA-C 09/14/14 1320  Marisa Severin, MD 09/20/14 1739

## 2014-09-10 NOTE — Discharge Instructions (Signed)
General Headache Without Cause  A general headache is pain or discomfort felt around the head or neck area. The cause may not be found.   HOME CARE   · Keep all doctor visits.  · Only take medicines as told by your doctor.  · Lie down in a dark, quiet room when you have a headache.  · Keep a journal to find out if certain things bring on headaches. For example, write down:  ¨ What you eat and drink.  ¨ How much sleep you get.  ¨ Any change to your diet or medicines.  · Relax by getting a massage or doing other relaxing activities.  · Put ice or heat packs on the head and neck area as told by your doctor.  · Lessen stress.  · Sit up straight. Do not tighten (tense) your muscles.  · Quit smoking if you smoke.  · Lessen how much alcohol you drink.  · Lessen how much caffeine you drink, or stop drinking caffeine.  · Eat and sleep on a regular schedule.  · Get 7 to 9 hours of sleep, or as told by your doctor.  · Keep lights dim if bright lights bother you or make your headaches worse.  GET HELP RIGHT AWAY IF:   · Your headache becomes really bad.  · You have a fever.  · You have a stiff neck.  · You have trouble seeing.  · Your muscles are weak, or you lose muscle control.  · You lose your balance or have trouble walking.  · You feel like you will pass out (faint), or you pass out.  · You have really bad symptoms that are different than your first symptoms.  · You have problems with the medicines given to you by your doctor.  · Your medicines do not work.  · Your headache feels different than the other headaches.  · You feel sick to your stomach (nauseous) or throw up (vomit).  MAKE SURE YOU:   · Understand these instructions.  · Will watch your condition.  · Will get help right away if you are not doing well or get worse.  Document Released: 12/12/2007 Document Revised: 05/27/2011 Document Reviewed: 02/22/2011  ExitCare® Patient Information ©2015 [employer]. This information is not intended to replace advice given to  you by your health care provider. Make sure you discuss any questions you have with your health care provider.

## 2014-09-11 ENCOUNTER — Other Ambulatory Visit: Payer: Self-pay | Admitting: Physician Assistant

## 2014-09-11 ENCOUNTER — Telehealth: Payer: Self-pay | Admitting: Physician Assistant

## 2014-09-11 DIAGNOSIS — R519 Headache, unspecified: Secondary | ICD-10-CM

## 2014-09-11 DIAGNOSIS — R51 Headache: Principal | ICD-10-CM

## 2014-09-11 NOTE — Telephone Encounter (Signed)
Contacted patient to see how she was doing, after I had advised her to go to the ED 09/09/2014.  It appears as she was there overnight, and I was calling to see if her headache has improved.

## 2014-09-12 ENCOUNTER — Other Ambulatory Visit: Payer: Self-pay | Admitting: Physician Assistant

## 2014-09-12 DIAGNOSIS — R51 Headache: Principal | ICD-10-CM

## 2014-09-12 DIAGNOSIS — R519 Headache, unspecified: Secondary | ICD-10-CM

## 2014-09-12 MED ORDER — HYDROCODONE-ACETAMINOPHEN 5-325 MG PO TABS: 1.0000 | ORAL_TABLET | Freq: Four times a day (QID) | ORAL | Status: DC | PRN

## 2014-09-12 NOTE — Progress Notes (Signed)
Patient continues to have a headache.  She has an appointment with neurology on Wednesday.  She states that she was seen at the ED where she was given neck injections, flexeril.  She states that this has not improved her sxs.  I am ordering the Norco 5-325.  She will see neuro within 48 hours.  I have advised her to inform neuro of the anti-inflammatories that she has attempted at home prior to her visit here as well.   -Boyfriend or grandmother or herself will pick up meds.

## 2014-09-14 ENCOUNTER — Telehealth: Payer: Self-pay | Admitting: *Deleted

## 2014-09-14 ENCOUNTER — Encounter: Payer: Self-pay | Admitting: Neurology

## 2014-09-14 ENCOUNTER — Ambulatory Visit (INDEPENDENT_AMBULATORY_CARE_PROVIDER_SITE_OTHER): Payer: BLUE CROSS/BLUE SHIELD | Admitting: Neurology

## 2014-09-14 VITALS — BP 104/71 | HR 88 | Temp 98.1°F | Ht 65.0 in | Wt 185.6 lb

## 2014-09-14 DIAGNOSIS — H9313 Tinnitus, bilateral: Secondary | ICD-10-CM | POA: Diagnosis not present

## 2014-09-14 DIAGNOSIS — R519 Headache, unspecified: Secondary | ICD-10-CM | POA: Insufficient documentation

## 2014-09-14 DIAGNOSIS — H4710 Unspecified papilledema: Secondary | ICD-10-CM | POA: Diagnosis not present

## 2014-09-14 DIAGNOSIS — G932 Benign intracranial hypertension: Secondary | ICD-10-CM

## 2014-09-14 DIAGNOSIS — H9190 Unspecified hearing loss, unspecified ear: Secondary | ICD-10-CM | POA: Insufficient documentation

## 2014-09-14 DIAGNOSIS — H53133 Sudden visual loss, bilateral: Secondary | ICD-10-CM | POA: Diagnosis not present

## 2014-09-14 DIAGNOSIS — H53139 Sudden visual loss, unspecified eye: Secondary | ICD-10-CM | POA: Insufficient documentation

## 2014-09-14 DIAGNOSIS — R42 Dizziness and giddiness: Secondary | ICD-10-CM | POA: Diagnosis not present

## 2014-09-14 DIAGNOSIS — R51 Headache: Secondary | ICD-10-CM

## 2014-09-14 DIAGNOSIS — H93A3 Pulsatile tinnitus, bilateral: Secondary | ICD-10-CM

## 2014-09-14 DIAGNOSIS — H538 Other visual disturbances: Secondary | ICD-10-CM | POA: Insufficient documentation

## 2014-09-14 DIAGNOSIS — H905 Unspecified sensorineural hearing loss: Secondary | ICD-10-CM

## 2014-09-14 NOTE — Progress Notes (Addendum)
[facility]    Provider:  Dr Lucia Gaskins Referring Provider: Garnetta Buddy, PA Primary Care Physician:  PROVIDER NOT IN SYSTEM  CC:  Migraine for 3 weeks  HPI:  Robin Aguirre is a 24 y.o. female here as a referral from Dr. Lenox Ponds for migraines for 3 weeks. Past medical history of ulcerative colitis. No significant history of headaches. Cousin with migraines but no immediate family members. She woke up with the headache 3 weeks ago. She was dizzy, her head hurt. Some days worse than others, then symptoms worsened and couldn't get out of the bed and she has not been out of bed for a week. Right now pain is at a 5/10 but she is dizzy. It is the whole head with pressure. She can't sleep, she can hear it pulsating in her head. She has been gaining weight since having kids. She has blurry vision. She has had episodes of blurry vision. Headache can be 10/10 pain and worse with laying down. Continous all day long. Can get to 10/10 pain with sever pressure and pulsating in the ears and behind the eyes. She had ESi in the neck Saturday which didn't help the headache. She endorses Light sensitivity, everything needs to be black, has to stay still. The best position is to hold her head. More pressure with pulsation when she is laying down. She has been to the ED multiple times, 3x in a week. She has been given toradol, benadryl, muscle relaxers, pain killers. Nothing has helped. +nausea, +sound sensitivity. No aura. No other focal neurologic deficits. No inciting factors, no previous illnesses. No head trauma.   Reviewed notes, labs and imaging from outside physicians, which showed:   CT of the head on 09/03/2014 showed No acute intracranial abnormalities including mass lesion or mass effect, hydrocephalus, extra-axial fluid collection, midline shift, hemorrhage, or acute infarction, large ischemic events (personally reviewed images). Pregnancy test negative.     Review of  Systems: Patient complains of symptoms per HPI as well as the following symptoms: . P blurred vision, feeling hot, ringing in ears, aching muscles, headache, weakness, dizziness, insomnia, decreased energy, disinterest in activities, racing thoughts ertinent negatives per HPI. All others negative.   History   Social History  . Marital Status: Single    Spouse Name: N/A  . Number of Children: 0  . Years of Education: N/A   Occupational History  . Not on file.   Social History Main Topics  . Smoking status: Former Smoker    Types: Cigarettes  . Smokeless tobacco: Not on file  . Alcohol Use: No  . Drug Use: No     Comment: Stopped smoking marijuana on 08/17/14  . Sexual Activity: Yes    Birth Control/ Protection: Implant   Other Topics Concern  . Not on file   Social History Narrative   Lives at home with grandparents and children.    Caffeine use: Drinks a few sodas a few times per week     Family History  Problem Relation Age of Onset  . Cancer Maternal Grandmother   . Migraines Neg Hx     Past Medical History  Diagnosis Date  . GERD (gastroesophageal reflux disease)   . Colitis, chronic, ulcerative     Past Surgical History  Procedure Laterality Date  . Colonoscopy  January 2016  . Removal of iud    . Wisdom tooth extraction  March 2016    Current Outpatient Prescriptions  Medication Sig Dispense Refill  .  acetaminophen (TYLENOL) 500 MG tablet Take 1,000 mg by mouth every 6 (six) hours as needed. Pain    . doxylamine, Sleep, (UNISOM) 25 MG tablet Take 100 mg by mouth at bedtime as needed for sleep.    Marland Kitchen HYDROcodone-acetaminophen (NORCO) 5-325 MG per tablet Take 1 tablet by mouth every 6 (six) hours as needed. 10 tablet 0  . meclizine (ANTIVERT) 25 MG tablet Take 1 tablet (25 mg total) by mouth 2 (two) times daily as needed for dizziness. 30 tablet 0  . methocarbamol (ROBAXIN) 500 MG tablet Take 1 tablet (500 mg total) by mouth 4 (four) times daily. 20 tablet 0   . Tetrahydrozoline HCl (VISINE OP) Apply 1-2 drops to eye daily as needed (dry eyes).    . traMADol (ULTRAM) 50 MG tablet Take 1 tablet (50 mg total) by mouth every 8 (eight) hours as needed. 20 tablet 0  . cyclobenzaprine (FLEXERIL) 10 MG tablet Take 1 tablet (10 mg total) by mouth 2 (two) times daily as needed for muscle spasms. (Patient not taking: Reported on 09/14/2014) 20 tablet 0  . [DISCONTINUED] sucralfate (CARAFATE) 1 GM/10ML suspension Take 10 mLs (1 g total) by mouth 4 (four) times daily. 420 mL 0   No current facility-administered medications for this visit.    Allergies as of 09/14/2014  . (No Known Allergies)    Vitals: BP 104/71 mmHg  Pulse 88  Temp(Src) 98.1 F (36.7 C) (Oral)  Ht  (1.651 m)  Wt 185 lb 9.6 oz (84.188 kg)  BMI 30.89 kg/m2  LMP 08/15/2014 Last Weight:  Wt Readings from Last 1 Encounters:  09/14/14 185 lb 9.6 oz (84.188 kg)   Last Height:   Ht Readings from Last 1 Encounters:  09/14/14  (1.651 m)   Physical exam: Exam: Gen: NAD, conversant, well nourised, obese, well groomed                     CV: RRR, no MRG. No Carotid Bruits. No peripheral edema, warm, nontender Eyes: Conjunctivae clear without exudates or hemorrhage  Neuro: Detailed Neurologic Exam  Speech:    Speech is normal; fluent and spontaneous with normal comprehension.  Cognition:    The patient is oriented to person, place, and time;     recent and remote memory intact;     language fluent;     normal attention, concentration,     fund of knowledge Cranial Nerves:    The pupils are equal, round, and reactive to light. Unclear if she has mild elevation of the disc heads. Visual fields are full to finger confrontation. Extraocular movements are intact. Trigeminal sensation is intact and the muscles of mastication are normal. The face is symmetric. The palate elevates in the midline. Hearing intact. Voice is normal. Shoulder shrug is normal. The tongue has normal  motion without fasciculations.   Coordination:    Normal finger to nose and heel to shin. Normal rapid alternating movements.   Gait:    Heel-toe and tandem gait are normal.   Motor Observation:    No asymmetry, no atrophy, and no involuntary movements noted. Tone:    Normal muscle tone.    Posture:    Posture is normal. normal erect    Strength:    Strength is V/V in the upper and lower limbs.      Sensation: intact to LT     Reflex Exam:  DTR's:    Deep tendon reflexes in the upper and lower extremities  are normal bilaterally.   Toes:    The toes are downgoing bilaterally.   Clonus:    Clonus is absent.  Assessment/Plan:  24 year old patient with no significant past medical history of headaches with 3 weeks of continuous, severe, pressure, positional headache. She also endorses blurry vision and hearing her pulse in her ears. Worse with laying down. Endorses photophobia. Unclear if this is new onset of migraine disorder that symptoms are concerning for idiopathic intracranial hypertension. Patient is not significantly obese(BMI 30.8) however she has gained significant weight since having children. We'll order imaging of the brain and pending results may consider lumbar puncture. Offered a migraine cocktail in the office and patient preferred to go home, she says she'll come back later this afternoon for procedure to help with her current headache. We'll order a CMP.  Naomie DeanAntonia Angel Weedon, MD  [facility] [address] [place], [place] [id number]  Phone [phone] Fax [phone]

## 2014-09-14 NOTE — Patient Instructions (Signed)
Overall you are doing fairly well but I do want to suggest a few things today:   Remember to drink plenty of fluid, eat healthy meals and do not skip any meals. Try to eat protein with a every meal and eat a healthy snack such as fruit or nuts in between meals. Try to keep a regular sleep-wake schedule and try to exercise daily, particularly in the form of walking, 20-30 minutes a day, if you can.   As far as diagnostic testing: Imaging of the brain and lab test  I would like to see you back later today hopefully, sooner if we need to. Please call us with any interim questions, concerns, problems, updates or refill requests.   Please also call us for any test results so we can go over those with you on the phone.  My clinical assistant and will answer any of your questions and relay your messages to me and also relay most of my messages to you.   Our phone number is [phone]. We also have an after hours call service for urgent matters and there is a physician on-call for urgent questions. For any emergencies you know to call 911 or go to the nearest emergency room

## 2014-09-14 NOTE — Telephone Encounter (Signed)
Left VM for pt to call back. Gave [facility] phone number. She can come now for migraine cocktail and she will have to be scheduled somewhere else for imaging because the truck today is full. Thank you!

## 2014-09-15 ENCOUNTER — Other Ambulatory Visit: Payer: Self-pay | Admitting: Neurology

## 2014-09-15 ENCOUNTER — Telehealth: Payer: Self-pay | Admitting: *Deleted

## 2014-09-15 ENCOUNTER — Encounter: Payer: Self-pay | Admitting: *Deleted

## 2014-09-15 DIAGNOSIS — G932 Benign intracranial hypertension: Secondary | ICD-10-CM

## 2014-09-15 LAB — COMPREHENSIVE METABOLIC PANEL
ALBUMIN: 4.5 g/dL (ref 3.5–5.5)
ALT: 8 IU/L (ref 0–32)
AST: 11 IU/L (ref 0–40)
Albumin/Globulin Ratio: 1.6 (ref 1.1–2.5)
Alkaline Phosphatase: 67 IU/L (ref 39–117)
BUN/Creatinine Ratio: 15 (ref 8–20)
BUN: 12 mg/dL (ref 6–20)
Bilirubin Total: <0.2 mg/dL (ref 0.0–1.2)
CO2: 22 mmol/L (ref 18–29)
CREATININE: 0.81 mg/dL (ref 0.57–1.00)
Calcium: 9.5 mg/dL (ref 8.7–10.2)
Chloride: 100 mmol/L (ref 97–108)
GFR calc Af Amer: 117 mL/min/{1.73_m2} (ref >59)
GFR, EST NON AFRICAN AMERICAN: 101 mL/min/{1.73_m2} (ref >59)
Globulin, Total: 2.8 g/dL (ref 1.5–4.5)
Glucose: 97 mg/dL (ref 65–99)
Potassium: 4.4 mmol/L (ref 3.5–5.2)
SODIUM: 136 mmol/L (ref 134–144)
TOTAL PROTEIN: 7.3 g/dL (ref 6.0–8.5)

## 2014-09-15 MED ORDER — TOPIRAMATE 25 MG PO TABS: 25.0000 mg | ORAL_TABLET | Freq: Two times a day (BID) | ORAL | Status: DC

## 2014-09-15 NOTE — Telephone Encounter (Signed)
Spoke with Aurea GraffJoan at [facility] and she is going to check on status of scheduling pt for MRI's/MRV. Told her we would like her scheduled as soon as possible. She is going to let scheduling know and call me back. Gave [facility] phone number and told her to [facility].

## 2014-09-15 NOTE — Telephone Encounter (Signed)
Spoke with Aurea GraffJoan from [facility] and she refaxed orders to scheduling department and marked them STAT to have her scheduled ASAP. She also put a note for Erskine SquibbJane, their Physiological scientistoffice administrator. Told her to call back if they need anything else.

## 2014-09-15 NOTE — Telephone Encounter (Signed)
Left detailed VM for pt to call back about lab results. Gave [facility] phone number and office hours. Told her Dr. Lucia GaskinsAhern and I are not here Friday's and we will be closed on Monday.

## 2014-09-15 NOTE — Progress Notes (Signed)
[facility] to [place] at 5:03pm. Received fax confirmation. Faxed to [phone].

## 2014-09-16 ENCOUNTER — Telehealth: Payer: Self-pay | Admitting: Neurology

## 2014-09-16 NOTE — Telephone Encounter (Signed)
Patient di dnot show again for migraine cocktail. See previous phone note.

## 2014-09-16 NOTE — Telephone Encounter (Signed)
Patient did not show again today for migraine cocktail. If her headache is so severe, she should be compliant with instructions. She is home taking care of her kids because of an issue with her babysitter but patient informed me her headache is so severe she can't get out of bed which is contradictory. Called and no one is home.  She asked for FMLA forms, will only fill out for very short time if any as I suspect headache is not as severe as she states.

## 2014-09-20 ENCOUNTER — Telehealth: Payer: Self-pay | Admitting: Neurology

## 2014-09-20 ENCOUNTER — Telehealth: Payer: Self-pay | Admitting: *Deleted

## 2014-09-20 NOTE — Telephone Encounter (Signed)
The patient fainted on Friday blacked out every time she stood up. She hasn't fainted since Friday but she has been having the feelings like she is going to. Total of 9 1/2 hours of sleep all weekend. Is having vibrating noise behind her ear that started last night. Still having dizziness.  The best number to contact her is [phone].

## 2014-09-20 NOTE — Telephone Encounter (Signed)
Spoke with pt and verified she had [employer] insurance. She verified she did. Told her she needed insurance to have migraine cocktail done in our office.  I also told her [facility] called and left VM to schedule LP and she stated "I did not get a call from them". I called and left VM and waiting to hear back from them. Told her [name] will call her back to schedule. Pt verbalized understanding.  She stated she will be here in 30-40minutes to have migraine cocktail. Told her I would let Inetta Fermoina know.

## 2014-09-20 NOTE — Telephone Encounter (Signed)
Robin Aguirre, can you make sure that her MRI is scheduled please? She missed multiple opportunities to get migraine cocktail last week, she did not return phone calls last week. I do not think we should give her sleeping aids. I can call her tomorrow.

## 2014-09-20 NOTE — Telephone Encounter (Signed)
Spoke with Olegario MessierKathy from [facility] and she stated they left several message to schedule MRI's and have been unable to reach pt. She said to have pt call [phone] and speak with scheduling.

## 2014-09-20 NOTE — Telephone Encounter (Signed)
Spoke with pt and told her she can come in after 12pm to have migraine cocktail and at the latest 4pm so Tina, our intrafusion nurse would have enough time to do her infusion. She states she will come with her boyfriend at 12:30pm so she has a driver. She stated her disability paperwork is due 10/01/14 but I advised her Dr. Lucia GaskinsAhern has at least 14 days to fill out paperwork and that would give Dr. Lucia GaskinsAhern until 10/04/14. Pt aware and understands.  Pt states she "thinks disability form will help pay for MRI/LP and wants to wait for those". I told her to call and schedule LP and MRI because Dr. Lucia GaskinsAhern would like these results before filling out paperwork.  [facility] [phone]) and told her to call [facility] because they have called and left messages for her. She stated she tried calling them earlier "but she was on hold and she wanted to take a nap so she was going to call them later". She wanted to find out how much MRI/LP would be and is going to call me to let me know what her decision is since she does not have insurance right now.

## 2014-09-20 NOTE — Telephone Encounter (Signed)
Spoke with pt and she stated she is still having headache from last week and fainted on Friday 09/16/14. Her boyfriend witnessed event and stated she felt like she was in a dream and fainted twice. She denies hitting her head. She had eaten and drank plenty of fluids before this occurred. Her dizziness has remained the same since seen in the office on 09/14/14. It has not gotten worse or any better. I advised her Dr. Lucia GaskinsAhern is out of the office until tomorrow and she should go to the ED or urgent care. She refused to go to ED stating "They are not going to do anything for me there and I do not feel like I need to go to the ED. I know you are a nurse and I trust your advise, but I do not want to go". I told her they could do a migraine cocktail for her there if they approve and she stated she is just going to wait until tomorrow when Dr. Lucia GaskinsAhern gets back to see if she can do a migraine cocktail. She stated she is going home and her Mom is coming over to watch the kids so she can get some sleep. She stated she has had hardly any sleep all weekend and having a hard time sleeping. I also told her I would ask the work-in doctor today to see if there is anything else that can be done. She verbalized understanding.

## 2014-09-20 NOTE — Telephone Encounter (Signed)
Left VM for [name] to make sure pt gets scheduled for LP and that she received my fax on Thursday. Gave [facility] phone number for her to call back.

## 2014-09-20 NOTE — Telephone Encounter (Signed)
If she comes in for migraine cocktail, talk to her about compliance with MRi of the brain and LP today. Give her the numbers to call and schedule asap this week, tell her she needs to call and schedule. I will not extend her FMLA if she is non compliant with workup. I left her a message to that effect last week.  If she is non-compliant I will also discharge her from the practice. I suspect there is exageration of her symptoms considering she is not returning calls for any workup. I suspect she has not even picked up the prescription I called in. Can you call the pharmacy and check please? thanks

## 2014-09-20 NOTE — Telephone Encounter (Signed)
Patient form from [place] on [place] desk.

## 2014-09-20 NOTE — Telephone Encounter (Signed)
Spoke with pt pharmacy and they stated pt picked up topamax Rx on 09/15/14.

## 2014-09-20 NOTE — Telephone Encounter (Signed)
She can come in for migraine cocktail if she still has a headache. Make sure her LP and MRIs are scheduled please. thanks

## 2014-09-20 NOTE — Telephone Encounter (Signed)
I did not call the patient. She has been contacted the patient to come in for therapy.

## 2014-09-20 NOTE — Telephone Encounter (Signed)
Patient called stating MRI is scheduled for 7/716 @ 11. She also said she is to have LP and wanted to make sure the [facility]. Please call and advise. Patient can be reached at [phone].

## 2014-09-21 ENCOUNTER — Other Ambulatory Visit: Payer: Self-pay | Admitting: Neurology

## 2014-09-21 ENCOUNTER — Telehealth: Payer: Self-pay | Admitting: Neurology

## 2014-09-21 DIAGNOSIS — Z0289 Encounter for other administrative examinations: Secondary | ICD-10-CM

## 2014-09-21 DIAGNOSIS — R55 Syncope and collapse: Secondary | ICD-10-CM

## 2014-09-21 MED ORDER — NORTRIPTYLINE HCL 10 MG PO CAPS: 20.0000 mg | ORAL_CAPSULE | Freq: Every day | ORAL | Status: DC

## 2014-09-21 NOTE — Telephone Encounter (Signed)
I left a message for patient. I am going to giver her 2 weeks of FMLA from the time she first saw me. This is plenty of time to get her workup completed. She has not been returning calls for lumbar puncture and mri of the brain. Please fill out as much as you can and get that to me, thanks you.

## 2014-09-21 NOTE — Telephone Encounter (Signed)
Spoke with pt and told her I faxed over order for EKG and received fax confirmation at 4:18pm today. Told her to [facility] at [phone] to set up appt. Pt also requested nortriptyline Rx. Spoke with Dr. Lucia GaskinsAhern and she is giving her 20mg  Nortriptyline and calling it into her pharmacy. Pt aware. She has MRI scheduled and states her headache came back after migraine cocktail yesterday. Told her I would let Dr. Lucia GaskinsAhern know.

## 2014-09-21 NOTE — Telephone Encounter (Signed)
Patient called to check the status of her FMLA forms. According to previous message we received the forms on 09/09/14 and the forms were partially filled out and placed in [place] box for further review and completion. Please return to [facility] as soon as possible, patient states that her employer needs these soon.   Thanks, [employer]asmine

## 2014-09-21 NOTE — Telephone Encounter (Signed)
Spoke to patient. She has MRi and LP scheduled. She had syncopal episodes, will order an EKG. Kara Meadmma, can you get that set up for her please and call her back today? thanks

## 2014-09-22 DIAGNOSIS — R51 Headache: Secondary | ICD-10-CM | POA: Diagnosis not present

## 2014-09-22 DIAGNOSIS — H4710 Unspecified papilledema: Secondary | ICD-10-CM | POA: Diagnosis not present

## 2014-09-24 ENCOUNTER — Other Ambulatory Visit: Payer: Self-pay | Admitting: Neurology

## 2014-09-24 DIAGNOSIS — H538 Other visual disturbances: Secondary | ICD-10-CM

## 2014-09-24 DIAGNOSIS — R51 Headache: Secondary | ICD-10-CM

## 2014-09-24 DIAGNOSIS — R42 Dizziness and giddiness: Secondary | ICD-10-CM

## 2014-09-24 DIAGNOSIS — R519 Headache, unspecified: Secondary | ICD-10-CM

## 2014-09-24 DIAGNOSIS — H4710 Unspecified papilledema: Secondary | ICD-10-CM

## 2014-09-24 DIAGNOSIS — H93A3 Pulsatile tinnitus, bilateral: Secondary | ICD-10-CM

## 2014-09-24 DIAGNOSIS — G932 Benign intracranial hypertension: Secondary | ICD-10-CM

## 2014-09-24 DIAGNOSIS — H9190 Unspecified hearing loss, unspecified ear: Secondary | ICD-10-CM

## 2014-09-24 DIAGNOSIS — H53133 Sudden visual loss, bilateral: Secondary | ICD-10-CM

## 2014-09-26 ENCOUNTER — Other Ambulatory Visit: Payer: Self-pay | Admitting: *Deleted

## 2014-09-26 ENCOUNTER — Telehealth: Payer: Self-pay | Admitting: Neurology

## 2014-09-26 ENCOUNTER — Other Ambulatory Visit: Payer: Self-pay | Admitting: Neurology

## 2014-09-26 ENCOUNTER — Ambulatory Visit
Admission: RE | Admit: 2014-09-26 | Discharge: 2014-09-26 | Disposition: A | Discharge status: Home / Self Care | Payer: BLUE CROSS/BLUE SHIELD | Source: Ambulatory Visit | Attending: Neurology | Admitting: Neurology

## 2014-09-26 DIAGNOSIS — G43811 Other migraine, intractable, with status migrainosus: Secondary | ICD-10-CM

## 2014-09-26 DIAGNOSIS — G932 Benign intracranial hypertension: Secondary | ICD-10-CM

## 2014-09-26 LAB — CSF CELL COUNT WITH DIFFERENTIAL
RBC Count, CSF: 0 cu mm
TUBE #: 3
WBC CSF: 2 uL (ref 0–5)

## 2014-09-26 LAB — GLUCOSE, CSF: Glucose, CSF: 55 mg/dL (ref 43–76)

## 2014-09-26 LAB — PROTEIN, CSF: TOTAL PROTEIN, CSF: 30 mg/dL (ref 15–45)

## 2014-09-26 NOTE — Telephone Encounter (Signed)
Pt is scheduled for an EKG and [facility] needs the actual order put into the computer, [phone] Robin Aguirre.

## 2014-09-26 NOTE — Telephone Encounter (Signed)
Left patient a message. Everything looks good, MRI of the brain and lumbar puncture normal. She is on two medications for her headache. It is fine to go back to work. She can call back for any questions. Thanks.

## 2014-09-26 NOTE — Telephone Encounter (Signed)
Spoke w/ pt about normal MRI brain and normal MRI orbits. Pt verbalized understanding. Pt went for LP at 8am this morning and was on her way home and verbalized understanding of having to be on flat bed rest for 24 hours. She said they told her that her spinal fluid looked okay. Told her we will look for those results as well. She is still having a headache. I told her I would let Dr Lucia GaskinsAhern know. She verbalized understanding.

## 2014-09-26 NOTE — Telephone Encounter (Signed)
Spoke with Donna/Kendra and put in new order for EKG-12 lead so pt can be scheduled at their office. Enrique SackKendra states she can see the new order. They verified they did receive my order last week that I faxed. Lupita LeashDonna states pt is scheduled to come in on Thursday to have EKG done. Enrique SackKendra stated Lupita LeashDonna left a VM for pt to call and schedule EKG and they are waiting for a call back.

## 2014-09-26 NOTE — Discharge Instructions (Signed)

## 2014-09-26 NOTE — Telephone Encounter (Signed)
Patient called requesting result for MRI. Please call and advise. Patient can be reached at [phone].

## 2014-09-26 NOTE — Telephone Encounter (Signed)
Spoke with pt to let her know her LP results were normal. She is scheduled for an EKG on Thursday 09/29/14. Told her Dr. Lucia GaskinsAhern will give her a call to talk about next steps. Told her to call back with any questions or concerns. Pt verbalized understanding.

## 2014-09-26 NOTE — Telephone Encounter (Signed)
Spoke w/ pt about normal MRI brain and normal MRI orbits. Pt verbalized understanding. Pt went for LP at 8am this morning and was on her way home and verbalized understanding of having to be on flat bed rest for 24 hours. She said they told her that her spinal fluid looked okay. Told her we will look for those results as well. She is still having a headache. I told her I would let Dr Ahern know. She verbalized understanding.  

## 2014-09-29 ENCOUNTER — Ambulatory Visit: Payer: BLUE CROSS/BLUE SHIELD

## 2014-09-29 ENCOUNTER — Telehealth: Payer: Self-pay

## 2014-09-29 NOTE — Telephone Encounter (Signed)
Just sending you notice that pt has not shown up for EKG appt.

## 2014-09-29 NOTE — Telephone Encounter (Signed)
Dianna - would you call and let patient know she did not show up for her EKG appointment and she will have to reschedule it herslef. thanks

## 2014-09-29 NOTE — Progress Notes (Signed)
Pt was no show for appt.  RN called for pt in lobby to make sure pt was not here.  [facility]. Notified.

## 2014-09-29 NOTE — Telephone Encounter (Signed)
Left message on vm with information below and left phone number for [facility]

## 2014-09-30 ENCOUNTER — Telehealth: Payer: Self-pay | Admitting: Neurology

## 2014-09-30 NOTE — Telephone Encounter (Signed)
Error

## 2014-10-04 ENCOUNTER — Telehealth: Payer: Self-pay | Admitting: *Deleted

## 2014-10-04 ENCOUNTER — Ambulatory Visit (INDEPENDENT_AMBULATORY_CARE_PROVIDER_SITE_OTHER): Payer: BLUE CROSS/BLUE SHIELD

## 2014-10-04 VITALS — BP 102/76 | HR 85 | Wt 184.0 lb

## 2014-10-04 DIAGNOSIS — G43909 Migraine, unspecified, not intractable, without status migrainosus: Secondary | ICD-10-CM | POA: Diagnosis not present

## 2014-10-04 NOTE — Patient Instructions (Signed)
Follow up with neurologist as directed

## 2014-10-04 NOTE — Addendum Note (Signed)
Addended by: Lake BellsEAGUE, Dhiren Azimi J on: 10/04/2014 02:19 PM   Modules accepted: Medications

## 2014-10-10 ENCOUNTER — Telehealth: Payer: Self-pay | Admitting: *Deleted

## 2014-10-10 ENCOUNTER — Telehealth: Payer: Self-pay | Admitting: Neurology

## 2014-10-10 NOTE — Telephone Encounter (Signed)
Spoke w/ pt and told her we will get return to work forms together today and will fax and make copies of it for her to pick up. She will pick up tomorrow or Wednesday. Return to work 10/13/14. Pt aware.  Called pt back and made her aware she needed to sign some copies. She will pick up tomorrow or Wednesday and will fax completed forms herself.

## 2014-10-10 NOTE — Telephone Encounter (Signed)
Patient form at the front desk. 

## 2014-10-10 NOTE — Telephone Encounter (Signed)
Patient called inquiring if  disability forms are ready. And if not ready she needs a writtent statement  Saying she can return to work. Please call and advise. Patient can be reached at [phone].

## 2014-10-13 ENCOUNTER — Telehealth: Payer: Self-pay | Admitting: Neurology

## 2014-10-13 NOTE — Telephone Encounter (Signed)
Pt requests a call back from nurse regarding headaches. States she has called several times and no one has called her back. Only time she has been called back was regarding std disability forms.

## 2014-10-14 NOTE — Telephone Encounter (Signed)
Called patient last night. Left a message. If she wants to discuss headaches she needs to schedule a follow up appointment.

## 2014-10-23 ENCOUNTER — Encounter (HOSPITAL_COMMUNITY): Payer: Self-pay | Admitting: Emergency Medicine

## 2014-10-23 ENCOUNTER — Encounter (HOSPITAL_COMMUNITY): Payer: Self-pay

## 2014-10-23 ENCOUNTER — Emergency Department (HOSPITAL_COMMUNITY)
Admission: EM | Admit: 2014-10-23 | Discharge: 2014-10-23 | Disposition: A | Discharge status: Other Acute Inpatient Hospital | Payer: BLUE CROSS/BLUE SHIELD | Attending: Emergency Medicine | Admitting: Emergency Medicine

## 2014-10-23 ENCOUNTER — Inpatient Hospital Stay (HOSPITAL_COMMUNITY)
Admission: AD | Admit: 2014-10-23 | Discharge: 2014-10-28 | DRG: 885 | Disposition: A | Discharge status: Home / Self Care | Payer: BLUE CROSS/BLUE SHIELD | Source: Intra-hospital | Attending: Psychiatry | Admitting: Psychiatry

## 2014-10-23 DIAGNOSIS — Z8719 Personal history of other diseases of the digestive system: Secondary | ICD-10-CM | POA: Diagnosis not present

## 2014-10-23 DIAGNOSIS — Z87891 Personal history of nicotine dependence: Secondary | ICD-10-CM | POA: Insufficient documentation

## 2014-10-23 DIAGNOSIS — F329 Major depressive disorder, single episode, unspecified: Secondary | ICD-10-CM | POA: Insufficient documentation

## 2014-10-23 DIAGNOSIS — G4700 Insomnia, unspecified: Secondary | ICD-10-CM | POA: Insufficient documentation

## 2014-10-23 DIAGNOSIS — F332 Major depressive disorder, recurrent severe without psychotic features: Secondary | ICD-10-CM | POA: Diagnosis present

## 2014-10-23 DIAGNOSIS — Z915 Personal history of self-harm: Secondary | ICD-10-CM | POA: Diagnosis not present

## 2014-10-23 DIAGNOSIS — F419 Anxiety disorder, unspecified: Secondary | ICD-10-CM | POA: Diagnosis not present

## 2014-10-23 DIAGNOSIS — R45851 Suicidal ideations: Secondary | ICD-10-CM | POA: Diagnosis present

## 2014-10-23 DIAGNOSIS — Z79899 Other long term (current) drug therapy: Secondary | ICD-10-CM | POA: Insufficient documentation

## 2014-10-23 DIAGNOSIS — F32A Depression, unspecified: Secondary | ICD-10-CM

## 2014-10-23 DIAGNOSIS — F1310 Sedative, hypnotic or anxiolytic abuse, uncomplicated: Secondary | ICD-10-CM | POA: Diagnosis not present

## 2014-10-23 HISTORY — DX: Headache, unspecified: R51.9

## 2014-10-23 HISTORY — DX: Headache: R51

## 2014-10-23 LAB — CBC
HCT: 38.6 % (ref 36.0–46.0)
Hemoglobin: 13.2 g/dL (ref 12.0–15.0)
MCH: 29.8 pg (ref 26.0–34.0)
MCHC: 34.2 g/dL (ref 30.0–36.0)
MCV: 87.1 fL (ref 78.0–100.0)
Platelets: 308 10*3/uL (ref 150–400)
RBC: 4.43 MIL/uL (ref 3.87–5.11)
RDW: 12.3 % (ref 11.5–15.5)
WBC: 8.2 10*3/uL (ref 4.0–10.5)

## 2014-10-23 LAB — COMPREHENSIVE METABOLIC PANEL
ALT: 36 U/L (ref 14–54)
AST: 43 U/L — ABNORMAL HIGH (ref 15–41)
Albumin: 4.2 g/dL (ref 3.5–5.0)
Alkaline Phosphatase: 59 U/L (ref 38–126)
Anion gap: 6 (ref 5–15)
BILIRUBIN TOTAL: 0.7 mg/dL (ref 0.3–1.2)
BUN: 8 mg/dL (ref 6–20)
CO2: 23 mmol/L (ref 22–32)
Calcium: 8.8 mg/dL — ABNORMAL LOW (ref 8.9–10.3)
Chloride: 109 mmol/L (ref 101–111)
Creatinine, Ser: 0.63 mg/dL (ref 0.44–1.00)
GFR calc Af Amer: >60 mL/min (ref >60)
GFR calc non Af Amer: >60 mL/min (ref >60)
GLUCOSE: 108 mg/dL — AB (ref 65–99)
Potassium: 3.7 mmol/L (ref 3.5–5.1)
Sodium: 138 mmol/L (ref 135–145)
Total Protein: 7.5 g/dL (ref 6.5–8.1)

## 2014-10-23 LAB — SALICYLATE LEVEL: Salicylate Lvl: <4 mg/dL (ref 2.8–30.0)

## 2014-10-23 LAB — RAPID URINE DRUG SCREEN, HOSP PERFORMED
Amphetamines: NOT DETECTED
Barbiturates: NOT DETECTED
Benzodiazepines: POSITIVE — AB
Cocaine: NOT DETECTED
Opiates: NOT DETECTED
Tetrahydrocannabinol: NOT DETECTED

## 2014-10-23 LAB — ACETAMINOPHEN LEVEL

## 2014-10-23 LAB — ETHANOL: Alcohol, Ethyl (B): <5 mg/dL (ref <5)

## 2014-10-23 MED ORDER — LORAZEPAM 1 MG PO TABS: 1.0000 mg | ORAL_TABLET | Freq: Three times a day (TID) | ORAL | Status: DC | PRN

## 2014-10-23 MED ORDER — ALUM & MAG HYDROXIDE-SIMETH 200-200-20 MG/5ML PO SUSP: 30.0000 mL | ORAL | Status: DC | PRN

## 2014-10-23 MED ORDER — ZOLPIDEM TARTRATE 5 MG PO TABS: 5.0000 mg | ORAL_TABLET | Freq: Every evening | ORAL | Status: DC | PRN

## 2014-10-23 MED ORDER — ACETAMINOPHEN 325 MG PO TABS: 650.0000 mg | ORAL_TABLET | ORAL | Status: DC | PRN

## 2014-10-23 MED ORDER — TRAZODONE HCL 50 MG PO TABS
50.0000 mg | ORAL_TABLET | Freq: Every day | ORAL | Status: DC
  Administered 2014-10-23: 50 mg via ORAL
  Filled 2014-10-23 (×3): qty 1

## 2014-10-23 MED ORDER — ACETAMINOPHEN 325 MG PO TABS
650.0000 mg | ORAL_TABLET | Freq: Four times a day (QID) | ORAL | Status: DC | PRN
  Administered 2014-10-23 – 2014-10-26 (×3): 650 mg via ORAL
  Filled 2014-10-23 (×3): qty 2

## 2014-10-23 MED ORDER — ONDANSETRON HCL 4 MG PO TABS: 4.0000 mg | ORAL_TABLET | Freq: Three times a day (TID) | ORAL | Status: DC | PRN

## 2014-10-23 MED ORDER — TRAZODONE HCL 50 MG PO TABS
50.0000 mg | ORAL_TABLET | Freq: Every evening | ORAL | Status: DC | PRN
  Administered 2014-10-24: 50 mg via ORAL
  Filled 2014-10-23 (×3): qty 1

## 2014-10-23 MED ORDER — HYDROXYZINE HCL 25 MG PO TABS
25.0000 mg | ORAL_TABLET | Freq: Four times a day (QID) | ORAL | Status: DC | PRN
Start: 2014-10-23 — End: 2014-10-28
  Administered 2014-10-23 – 2014-10-27 (×5): 25 mg via ORAL
  Filled 2014-10-23 (×5): qty 1

## 2014-10-23 MED ORDER — TRAZODONE HCL 50 MG PO TABS: 50.0000 mg | ORAL_TABLET | Freq: Every evening | ORAL | Status: DC | PRN

## 2014-10-23 MED ORDER — ALUM & MAG HYDROXIDE-SIMETH 200-200-20 MG/5ML PO SUSP
30.0000 mL | ORAL | Status: DC | PRN
  Administered 2014-10-24: 30 mL via ORAL
  Filled 2014-10-23: qty 30

## 2014-10-23 MED ORDER — MAGNESIUM HYDROXIDE 400 MG/5ML PO SUSP: 30.0000 mL | Freq: Every day | ORAL | Status: DC | PRN

## 2014-10-23 MED ORDER — IBUPROFEN 200 MG PO TABS: 600.0000 mg | ORAL_TABLET | Freq: Three times a day (TID) | ORAL | Status: DC | PRN

## 2014-10-23 NOTE — ED Notes (Addendum)
Pt admits to suicidal ideation yet no plan. Pt expresses overwhelmed with children care and work. 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 Pt sts that she "want somebody else ends her life". Pt in tears. Father at bedside. Denies HI. First reports this is first time having SI. Pt reports also never treated for depression.

## 2014-10-23 NOTE — ED Notes (Signed)
Pt changed into scrubs and wanded by security  

## 2014-10-23 NOTE — BH Assessment (Signed)
Tele Assessment Note   Robin Aguirre is an 24 y.o. female. Writer spoke w/ Marlon Pel PA-C re: pt's presentation prior to assessment. Pt is cooperative and oriented x 4. Her dad, Robin Aguirre, is bedside. She endorses SI for several mos. She says, "I wish death upon myself." She states, "I could care less if I died." Pt says she has "a bunch of pills at my house" and she thinks daily about taking an overdose. Pt's father reports he has 2 prior suicide attempts "due to PTSD." Patient reports she can not contract for safety. She reports she is afraid she might harm herself if she goes back home. PT endorses chronic insomnia (8 yrs), fatigue, loss of interest in usual pleasures, isolating bx, guilt and worthlessness. She sts she likes to spend most of her time in her room with the light off. Pt reports chronic HA for the past 2-3 mos and reports she has been to several medical professionals to address HA. She says she worked full time at [employer] until she took short term disability 2 mos ago. She reports she was going to go in tomorrow to see if she still has her job. She says her disability ran out but she hadn't been ready to go back to work. Pt reports moderate anxiety. She sts her anxiety is worse in the am when her kids wake up. Pt says that her anxiety is the worst during the day. She reports she smokes THC three times a month. Pt reports she loves her kids and asks Clinical research associate if Clinical research associate "is going to go to my house and take my kids." Pt explains that pt and 2 kids live w/ pt's grandparents who are good caretakers of the kids. Writer ran pt by Dahlia Byes NP who recommends inpatient treatment.    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 GERD (gastroesophageal reflux disease)   . Colitis, chronic, ulcerative    Axis IV: occupational problems, other psychosocial or  environmental problems and problems related to social environment Axis V: 31-40 impairment in reality testing  Past Medical History:  Past Medical History  Diagnosis Date  . GERD (gastroesophageal reflux disease)   . Colitis, chronic, ulcerative     Past Surgical History  Procedure Laterality Date  . Colonoscopy  January 2016  . Removal of iud    . Wisdom tooth extraction  March 2016    Family History:  Family History  Problem Relation Age of Onset  . Cancer Maternal Grandmother   . Migraines Neg Hx     Social History:  reports that she has quit smoking. Her smoking use included Cigarettes. She does not have any smokeless tobacco history on file. She reports that she does not drink alcohol or use illicit drugs.  Additional Social History:  Alcohol / Drug Use Pain Medications: pt denies abuse - see PTA meds list Prescriptions: pt denies abuse - see PTA meds list Over the Counter: pt denies abuse - see PTA meds list History of alcohol / drug use?: Yes Substance #1 Name of Substance 1: marijuana 1 - Age of First Use: 18 1 - Amount (size/oz): "one or two hits" 1 - Frequency: 3 times monthly 1 - Duration: a few years 1 - Last Use / Amount: unknown  CIWA: CIWA-Ar BP: 121/76 mmHg Pulse Rate: 93 COWS:    PATIENT STRENGTHS: (choose at least  two) Ability for insight Average or above average intelligence Communication skills  Allergies: No Known Allergies  Home Medications:  (Not in a hospital admission)  OB/GYN Status:  No LMP recorded. Patient has had an implant.  General Assessment Data Location of Assessment: [facility] TTS Assessment: In system Is this a Tele or Face-to-Face Assessment?: Face-to-Face Is this an Initial Assessment or a Re-assessment for this encounter?: Initial Assessment Marital status: Single Is patient pregnant?: No Living Arrangements: Other relatives, Children (grandparents, 2 kids (ages 40 & 6)) Can pt return to current living arrangement?:  Yes Admission Status: Voluntary Is patient capable of signing voluntary admission?: Yes Referral Source: Self/Family/Friend Insurance type: BCBS     Crisis Care Plan Living Arrangements: Other relatives, Children (grandparents, 2 kids (ages 3 & 6)) Name of Psychiatrist: none Name of Therapist: none  Education Status Is patient currently in school?: No  Risk to self with the past 6 months Suicidal Ideation: Yes-Currently Present Has patient been a risk to self within the past 6 months prior to admission? : Yes Has patient had any suicidal intent within the past 6 months prior to admission? : No Is patient at risk for suicide?: Yes Suicidal Plan?: Yes-Currently Present (pt often thinks of overdosing on her pills at home) Has patient had any suicidal plan within the past 6 months prior to admission? : Yes Access to Means: Yes Specify Access to Suicidal Means: access to meds What has been your use of drugs/alcohol within the last 12 months?: three times a month THC use Previous Attempts/Gestures: No How many times?: 0 Other Self Harm Risks: none Triggers for Past Attempts:  (n/a) Intentional Self Injurious Behavior: None Family Suicide History: Yes (dad has attempted suicide twice) Recent stressful life event(s): Other (Comment), Recent negative physical changes (chronic HA, depressive sxs) Persecutory voices/beliefs?: No Depression: Yes Depression Symptoms: Isolating, Insomnia, Guilt, Fatigue, Loss of interest in usual pleasures, Feeling worthless/self pity Substance abuse history and/or treatment for substance abuse?: No Suicide prevention information given to non-admitted patients: Not applicable  Risk to Others within the past 6 months Homicidal Ideation: No Does patient have any lifetime risk of violence toward others beyond the six months prior to admission? : No Thoughts of Harm to Others: No Current Homicidal Intent: No Current Homicidal Plan: No Access to Homicidal  Means: No Identified Victim: none History of harm to others?: No Assessment of Violence: None Noted Violent Behavior Description: pt denies hx violence - is calm and cooperative Does patient have access to weapons?: No Criminal Charges Pending?: No Does patient have a court date: No Is patient on probation?: No  Psychosis Hallucinations: None noted Delusions: None noted  Mental Status Report Appearance/Hygiene: Unremarkable, In scrubs Eye Contact: Good Motor Activity: Freedom of movement Speech: Logical/coherent Level of Consciousness: Quiet/awake Mood: Depressed, Anxious, Sad, Anhedonia Affect: Appropriate to circumstance, Anxious, Sad Anxiety Level: Moderate Thought Processes: Relevant, Coherent Judgement: Unimpaired Orientation: Person, Place, Situation, Time Obsessive Compulsive Thoughts/Behaviors: None  Cognitive Functioning Concentration: Normal Memory: Remote Impaired, Recent Impaired IQ: Average Insight: Good Impulse Control: Fair Appetite: Fair Sleep: No Change (pt reports insomnia for 8 yrs) Total Hours of Sleep: 4 Vegetative Symptoms:  (staying in room, with lights off)  ADLScreening [facility] Assessment Services) Patient's cognitive ability adequate to safely complete daily activities?: Yes Patient able to express need for assistance with ADLs?: Yes Independently performs ADLs?: Yes (appropriate for developmental age)  Prior Inpatient Therapy Prior Inpatient Therapy: No Prior Therapy Dates: na Prior Therapy Facilty/Provider(s): na Reason  for Treatment: na  Prior Outpatient Therapy Prior Outpatient Therapy: No Prior Therapy Dates: na Prior Therapy Facilty/Provider(s): na Reason for Treatment: na Does patient have an ACCT team?: No Does patient have Intensive In-House Services?  : No Does patient have [facility] services? : No Does patient have P4CC services?: Unknown  ADL Screening (condition at time of admission) Patient's cognitive ability adequate to  safely complete daily activities?: Yes Is the patient deaf or have difficulty hearing?: No Does the patient have difficulty seeing, even when wearing glasses/contacts?: No Does the patient have difficulty concentrating, remembering, or making decisions?: Yes Patient able to express need for assistance with ADLs?: Yes Does the patient have difficulty dressing or bathing?: No Independently performs ADLs?: Yes (appropriate for developmental age) Does the patient have difficulty walking or climbing stairs?: No Weakness of Legs: None Weakness of Arms/Hands: None  Home Assistive Devices/Equipment Home Assistive Devices/Equipment: None    Abuse/Neglect Assessment (Assessment to be complete while patient is alone) Physical Abuse: Denies Verbal Abuse: Denies Sexual Abuse: Denies Exploitation of patient/patient's resources: Denies Self-Neglect: Denies     Merchant navy officer (For Healthcare) Does patient have an advance directive?: No Would patient like information on creating an advanced directive?: No - patient declined information    Additional Information 1:1 In Past 12 Months?: No CIRT Risk: No Elopement Risk: No Does patient have medical clearance?: Yes     Disposition:  Disposition Initial Assessment Completed for this Encounter: Yes Disposition of Patient: Inpatient treatment program Type of inpatient treatment program: Adult (josephine onuoha np recommends inpatient treatment)  Lilyan Prete P 10/23/2014 4:37 PM

## 2014-10-23 NOTE — ED Notes (Signed)
Bed: [facility]36 Expected date:  Expected time:  Means of arrival:  Comments: Triage 4 

## 2014-10-23 NOTE — ED Notes (Signed)
Up to the bathroom 

## 2014-10-23 NOTE — ED Notes (Signed)
TTS at bedside. 

## 2014-10-23 NOTE — ED Provider Notes (Signed)
CSN: [id number]     Arrival date & time 10/23/14  1445 History   First MD Initiated Contact with Patient 10/23/14 1507     Chief Complaint  Patient presents with  . Suicidal     (Consider location/radiation/quality/duration/timing/severity/associated sxs/prior Treatment) HPI   Patient is currently voluntary  PCP: PROVIDER NOT IN SYSTEM Blood pressure 121/76, pulse 93, temperature 98.3 F (36.8 C), temperature source Oral, resp. rate 16, SpO2 100 %.  Robin Aguirre is a 24 y.o.female with a significant PMH of colitis and GERD presents to the ER with complaints of suicidal ideations that has been present off and on for months. 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 These headaches are severe, she reports having MRI's, CT scans and lumbar punctures to evaluate these but nothing is abnormal and no medications work- this is a major part of her depression and SI. She also reports insomnia. She thinks about taking medications to OD, shooting herself or pulling in front of an 18 wheeler but does not that she would do it. She smokes marijuana a few times a month but other wise denies substance abuse. Denies taking any daily medications even though she has been prescribed them before. Denies hx of SI or depression. Patients father is present and supportive at bedside- per nursing notes. Requests medication for sleep at night and anxiety. Denies HI or hallucinations.   Past Medical History  Diagnosis Date  . GERD (gastroesophageal reflux disease)   . Colitis, chronic, ulcerative    Past Surgical History  Procedure Laterality Date  . Colonoscopy  January 2016  . Removal of iud    . Wisdom tooth extraction  March 2016   Family History  Problem Relation Age of Onset  . Cancer Maternal Grandmother   . Migraines Neg Hx    History  Substance Use Topics  . Smoking status: Former Smoker    Types: Cigarettes  . Smokeless tobacco: Not on file  . Alcohol  Use: No   OB History    Gravida Para Term Preterm AB TAB SAB Ectopic Multiple Living   2 2 2       2      Review of Systems  10 Systems reviewed and are negative for acute change except as noted in the HPI.     Allergies  Review of patient's allergies indicates no known allergies.  Home Medications   Prior to Admission medications   Medication Sig Start Date End Date Taking? Authorizing Provider  acetaminophen (TYLENOL) 500 MG tablet Take 1,000 mg by mouth every 6 (six) hours as needed. Pain    Historical Provider, MD  cyclobenzaprine (FLEXERIL) 10 MG tablet Take 1 tablet (10 mg total) by mouth 2 (two) times daily as needed for muscle spasms. Patient not taking: Reported on 09/14/2014 09/10/14   Danelle Berry, PA-C  doxylamine, Sleep, (UNISOM) 25 MG tablet Take 100 mg by mouth at bedtime as needed for sleep.    Historical Provider, MD  HYDROcodone-acetaminophen (NORCO) 5-325 MG per tablet Take 1 tablet by mouth every 6 (six) hours as needed. Patient not taking: Reported on 10/04/2014 09/12/14   Collie Siad English, PA  meclizine (ANTIVERT) 25 MG tablet Take 1 tablet (25 mg total) by mouth 2 (two) times daily as needed for dizziness. Patient not taking: Reported on 10/04/2014 09/10/14   Danelle Berry, PA-C  methocarbamol (ROBAXIN) 500 MG tablet Take 1 tablet (500 mg total) by mouth 4 (four) times  daily. Patient not taking: Reported on 10/04/2014 09/05/14   Collie Siad English, PA  nortriptyline (PAMELOR) 10 MG capsule Take 2 capsules (20 mg total) by mouth at bedtime. Patient not taking: Reported on 10/04/2014 09/21/14   Anson Fret, MD  Tetrahydrozoline HCl (VISINE OP) Apply 1-2 drops to eye daily as needed (dry eyes).    Historical Provider, MD  topiramate (TOPAMAX) 25 MG tablet Take 1 tablet (25 mg total) by mouth 2 (two) times daily. 09/15/14   Anson Fret, MD  traMADol (ULTRAM) 50 MG tablet Take 1 tablet (50 mg total) by mouth every 8 (eight) hours as needed. Patient not taking:  Reported on 10/04/2014 09/05/14   Collie Siad English, PA   BP 121/76 mmHg  Pulse 93  Temp(Src) 98.3 F (36.8 C) (Oral)  Resp 16  SpO2 100% Physical Exam  Constitutional: She appears well-developed and well-nourished. No distress.  HENT:  Head: Normocephalic and atraumatic.  Eyes: Pupils are equal, round, and reactive to light.  Neck: Normal range of motion. Neck supple.  Cardiovascular: Normal rate and regular rhythm.   Pulmonary/Chest: Effort normal.  Abdominal: Soft.  Neurological: She is alert.  Skin: Skin is warm and dry.  Psychiatric: Her behavior is normal. Her mood appears anxious. Her speech is not rapid and/or pressured. She is not actively hallucinating. She exhibits a depressed mood. She expresses suicidal ideation. She expresses no homicidal ideation. She expresses suicidal plans. She expresses no homicidal plans.  Nursing note and vitals reviewed.   ED Course  Procedures (including critical care time) Labs Review Labs Reviewed  COMPREHENSIVE METABOLIC PANEL  ETHANOL  SALICYLATE LEVEL  ACETAMINOPHEN LEVEL  CBC  URINE RAPID DRUG SCREEN, HOSP PERFORMED    Imaging Review No results found.   EKG Interpretation None      MDM   Final diagnoses:  Suicidal ideation  Depression  Insomnia    TTS consult placed Holding orders placed Home medications reviewed Labs are pending but patient is clinically stable at this time.  Filed Vitals:   10/23/14 1451  BP: 121/76  Pulse: 93  Temp: 98.3 F (36.8 C)  Resp: [address], PA-C 10/23/14 1532  Rolland Porter, MD 10/30/14 [phone]

## 2014-10-23 NOTE — ED Notes (Addendum)
Pt's father took pocketbook home.  Clothing given to [employer] staff.

## 2014-10-23 NOTE — ED Notes (Signed)
Pt. Noted in room. No complaints or concerns voiced. No distress or abnormal behavior noted. Will continue to monitor with security cameras. Q 15 minute rounds continue. 

## 2014-10-23 NOTE — BHH Counselor (Signed)
Per Berneice Heinrich [facility], pt has been accepted to bed 406-1 and can come over at 8 pm. Support paperwork signed and faxed to [facility]. Originals placed in pt's chart.  Evette Cristal, [place] Therapeutic Triage Specialist

## 2014-10-23 NOTE — ED Notes (Signed)
Report received from Janie Rambo RN. Pt. Alert and oriented in no distress denies SI, HI, AVH and pain.  Pt. Instructed to come to me with problems or concerns.Will continue to monitor for safety via security cameras and Q 15 minute checks. 

## 2014-10-24 ENCOUNTER — Encounter (HOSPITAL_COMMUNITY): Payer: Self-pay | Admitting: Psychiatry

## 2014-10-24 DIAGNOSIS — R45851 Suicidal ideations: Secondary | ICD-10-CM

## 2014-10-24 DIAGNOSIS — F332 Major depressive disorder, recurrent severe without psychotic features: Principal | ICD-10-CM

## 2014-10-24 LAB — PREGNANCY, URINE: Preg Test, Ur: NEGATIVE

## 2014-10-24 MED ORDER — ZOLPIDEM TARTRATE 5 MG PO TABS
5.0000 mg | ORAL_TABLET | Freq: Every evening | ORAL | Status: DC | PRN
  Administered 2014-10-24 – 2014-10-25 (×2): 5 mg via ORAL
  Filled 2014-10-24 (×2): qty 1

## 2014-10-24 MED ORDER — DULOXETINE HCL 30 MG PO CPEP
30.0000 mg | ORAL_CAPSULE | Freq: Every day | ORAL | Status: DC
  Administered 2014-10-24 – 2014-10-25 (×2): 30 mg via ORAL
  Filled 2014-10-24 (×5): qty 1

## 2014-10-24 NOTE — Progress Notes (Signed)
Recreation Therapy Notes  Date: 08.08.16 Time: 930 am Location: [address] Hall Group Room  Group Topic: Stress Management  Goal Area(s) Addresses:  Patient will verbalize importance of using healthy stress management.  Patient will identify positive emotions associated with healthy stress management.   Intervention: Stress Management  Activity : Guided Imagery Script. LRT will introduce and instruct patients on the stress management technique of guided imagery. Patients were asked to follow a long with a script read a loud by LRT to participate in the stress management technique of guided imagery.  Education: Stress Management, Discharge Planning.   Education Outcome: Acknowledges edcuation/In group clarification offered/Needs additional education  Clinical Observations/Feedback: Patient did not attend group.   Tauheedah Bok, LRT/CTRS         Vernel Langenderfer A 10/24/2014 4:36 PM 

## 2014-10-24 NOTE — Tx Team (Signed)
Initial Interdisciplinary Treatment Plan   PATIENT STRESSORS: Financial difficulties Health problems Marital or family conflict   PATIENT STRENGTHS: Ability for insight Average or above average intelligence Communication skills General fund of knowledge Motivation for treatment/growth Supportive family/friends   PROBLEM LIST: Problem List/Patient Goals Date to be addressed Date deferred Reason deferred Estimated date of resolution  Risk for suicide 10/24/14     stress 10/24/14     depression 10/24/14     Migraines 10/24/14     "want help with these migraines" 10/24/14                              DISCHARGE CRITERIA:  Improved stabilization in mood, thinking, and/or behavior Verbal commitment to aftercare and medication compliance  PRELIMINARY DISCHARGE PLAN: Attend aftercare/continuing care group Outpatient therapy  PATIENT/FAMIILY INVOLVEMENT: This treatment plan has been presented to and reviewed with the patient, Robin Aguirre.  The patient and family have been given the opportunity to ask questions and make suggestions.  [name] 10/24/2014, 12:39 AM

## 2014-10-24 NOTE — Progress Notes (Signed)
Patient ID: Robin Aguirre, female   DOB: 1990-08-24, 24 y.o.   MRN: [id number]  Admission Note:  D:24 yr female who presents VC in no acute distress for the treatment of SI and Depression. Pt appears flat and depressed. Pt was calm and cooperative with admission process. Pt denies SI/HI/AVH at this time. Pt stated she has been having increasing migraines x 2 months and they have not been able to find the etiology. Pt stated this has caused her to feel SI, but she thinks she could never really do it. Pt stated she would like to get help.   A:Skin was assessed and found to be clear of any abnormal marks apart from a scar on R-knee, scab to L-ankle, birthmark under breast ans psoriasis on back. PT searched and no contraband found, POC and unit policies explained and understanding verbalized. Consents obtained. Food and fluids offered, and fluids accepted.  R: Pt had no additional questions or concerns.

## 2014-10-24 NOTE — BHH Group Notes (Signed)
[facility] LCSW Aftercare Discharge Planning Group Note  10/24/2014 8:45 AM  Participation Quality: Alert, Appropriate and Oriented  Mood/Affect: Appropriate  Depression Rating: 4  Anxiety Rating: 2  Thoughts of Suicide: Pt denies SI/HI  Will you contract for safety? Yes  Current AVH: Pt denies  Plan for Discharge/Comments: Pt attended discharge planning group and actively participated in group. CSW discussed suicide prevention education with the group and encouraged them to discuss discharge planning and any relevant barriers. Pt reports feeling tired due to chronic insomnia. She is requesting referrals to psychiatrist and therapist.  Transportation Means: Pt reports access to transportation  Supports: Father  Chad Cordial, Theresia Majors 10/24/2014 9:29 AM

## 2014-10-24 NOTE — [facility] Suicide Risk Assessment (Signed)
[facility] Admission Suicide Risk Assessment   Nursing information obtained from:    Demographic factors:   24 year old employed female, has two children Current Mental Status:   see below  Loss Factors:   chronic headaches  Historical Factors:   depression Risk Reduction Factors:   sense of responsibility to her family and children Total Time spent with patient: 45 minutes Principal Problem: Major Depression,  Severe, Recurrent ,without Psychotic Symptoms.  Diagnosis:   Patient Active Problem List   Diagnosis Date Noted  . MDD (major depressive disorder), recurrent severe, without psychosis [F33.2] 10/23/2014  . Headache disorder [R51] 09/14/2014  . Dizzy [R42] 09/14/2014  . Blurry vision, bilateral [H53.8] 09/14/2014  . Pulsatile tinnitus of both ears [H93.13] 09/14/2014  . Perceived hearing changes [H90.5] 09/14/2014  . Vision, loss, sudden [H53.139] 09/14/2014     Continued Clinical Symptoms:  Alcohol Use Disorder Identification Test Final Score (AUDIT): 1 The "Alcohol Use Disorders Identification Test", Guidelines for Use in Primary Care, Second Edition.  World Science writer [facility]). Score between 0-7:  no or low risk or alcohol related problems. Score between 8-15:  moderate risk of alcohol related problems. Score between 16-19:  high risk of alcohol related problems. Score 20 or above:  warrants further diagnostic evaluation for alcohol dependence and treatment.   CLINICAL FACTORS:   25 year old female, developed worsening depression with emergence of SI, in the context of chronic headaches, which have been worked up by Insurance account manager .   Musculoskeletal: Strength & Muscle Tone: within normal limits Gait & Station: normal Patient leans: N/A  Psychiatric Specialty Exam: Physical Exam  ROS  Blood pressure 104/70, pulse 104, temperature 98 F (36.7 C), temperature source Oral, resp. rate 16, height  (1.676 m), weight 187 lb (84.823 kg).Body mass index is 30.2  kg/(m^2).   See Admit Note MSE                                                        COGNITIVE FEATURES THAT CONTRIBUTE TO RISK:  Closed-mindedness    SUICIDE RISK:   Moderate:  Frequent suicidal ideation with limited intensity, and duration, some specificity in terms of plans, no associated intent, good self-control, limited dysphoria/symptomatology, some risk factors present, and identifiable protective factors, including available and accessible social support.  PLAN OF CARE: Patient will be admitted to inpatient psychiatric unit for stabilization and safety. Will provide and encourage milieu participation. Provide medication management and maked adjustments as needed.  Will follow daily.    Medical Decision Making:  Review of Psycho-Social Stressors (1), Review or order clinical lab tests (1), Established Problem, Worsening (2) and Review of New Medication or Change in Dosage (2)  I certify that inpatient services furnished can reasonably be expected to improve the patient's condition.   COBOS, FERNANDO 10/24/2014, 1:51 PM

## 2014-10-24 NOTE — Progress Notes (Signed)
[facility] Group Notes:  (Nursing/MHT/Case Management/Adjunct)  Date:  10/24/2014  Time:  10:16 PM  Type of Therapy:  Psychoeducational Skills  Participation Level:  Active  Participation Quality:  Appropriate  Affect:  Appropriate  Cognitive:  Appropriate  Insight:  Appropriate  Engagement in Group:  Engaged  Modes of Intervention:  Discussion  Summary of Progress/Problems: Tonight in wrap up group Robin Aguirre said her day was a 7.5 she said her head did not hurt as badly today and she was able to take a nap and she hasnt been able to sleep in a while so she was happy about that.  Robin Aguirre 10/24/2014, 10:16 PM

## 2014-10-24 NOTE — [facility] Group Notes (Signed)
[facility] LCSW Group Therapy  10/24/2014 1:15pm  Type of Therapy:  Group Therapy vercoming Obstacles  Participation Level:  Active  Participation Quality:  Appropriate   Affect:  Appropriate  Cognitive:  Appropriate and Oriented  Insight:  Developing/Improving and Improving  Engagement in Therapy:  Improving  Modes of Intervention:  Discussion, Exploration, Problem-solving and Support  Description of Group:   In this group patients will be encouraged to explore what they see as obstacles to their own wellness and recovery. They will be guided to discuss their thoughts, feelings, and behaviors related to these obstacles. The group will process together ways to cope with barriers, with attention given to specific choices patients can make. Each patient will be challenged to identify changes they are motivated to make in order to overcome their obstacles. This group will be process-oriented, with patients participating in exploration of their own experiences as well as giving and receiving support and challenge from other group members.  Summary of Patient Progress: Pt was actively involved in group discussion and interacted well with peers. Pt identified stress from children and chronic migraines as her main obstacles at this time. She discussed with the group how she experiencing "half way losing things" which is motivating her to address her depression. Pt reported that she recognizes the need to address her depression so she can function like she has previously.   Therapeutic Modalities:   Cognitive Behavioral Therapy Solution Focused Therapy Motivational Interviewing Relapse Prevention Therapy   Chad Cordial, LCSWA 10/24/2014 2:58 PM\

## 2014-10-24 NOTE — Progress Notes (Signed)
D: Patient is alert and oriented. Pt's mood and affect is depressed and anxious. Pt denies SI/HI and AVH. Pt rates depression 6/10, hopelessness 4/10, and anxiety 7/10. Pt reports her goal for the day is "learning to cope with stress." Pt reports headache 7/10, refuses PRN medication. Pt has two abrasions on right leg from recent fall "while ice skating." Pt also reports that she has "psoriasis" all over her body. A: Active listening by RN. Encouragement/Support provided to pt. Urine specimen collected per providers orders and placed in specimen collection refrigerator, awaiting lab pick up. Medication education reviewed with pt. PRN medication offered for pain, pt refused. Scheduled medications administered per providers orders (See MAR). 15 minute checks continued per protocol for patient safety.  R: Patient cooperative and receptive to nursing interventions. Pt remains safe.
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
## 2014-10-24 NOTE — [facility] Suicide Risk Assessment (Signed)
[facility] INPATIENT:  Family/Significant Other Suicide Prevention Education  Suicide Prevention Education:  Education Completed; [name], Pt's father [phone], has been identified by the patient as the family member/significant other with whom the patient will be residing, and identified as the person(s) who will aid the patient in the event of a mental health crisis (suicidal ideations/suicide attempt).  With written consent from the patient, the family member/significant other has been provided the following suicide prevention education, prior to the and/or following the discharge of the patient.  The suicide prevention education provided includes the following:  Suicide risk factors  Suicide prevention and interventions  National Suicide Hotline telephone number  [facility] assessment telephone number  [facility] Emergency Assistance 911  [facility] and/or Residential Mobile Crisis Unit telephone number  Request made of family/significant other to:  Remove weapons (e.g., guns, rifles, knives), all items previously/currently identified as safety concern.    Remove drugs/medications (over-the-counter, prescriptions, illicit drugs), all items previously/currently identified as a safety concern.  The family member/significant other verbalizes understanding of the suicide prevention education information provided.  The family member/significant other agrees to remove the items of safety concern listed above.  Elaina Hoops 10/24/2014, 3:48 PM

## 2014-10-24 NOTE — Progress Notes (Signed)
D: Francheska is very pleasant but anxious upon my introduction as her nurse for this shift. She states her anxiety level is 8-9/10. She is also complaining of diarrhea since dinner today and is requesting an antidiarrheal. She denies any pain at this time. Denies SI/HI/AVH.  Her goal for today was to identify triggers that make her depressed and anxious. Now that she can identify some of her triggers (finances) her next goal is to learn coping skills to assist her in identifying ways to relieve her stress. Tonight she discussed deep breathing as a way to cope with her anxiety along with playing Soduko. A: Encouraged Klaire to attend group therapy (she did) as a way to help her identify coping methods in regards to her depression and anxiety.   R: Medication administered for anxiety and diarrhea. Will continue to monitor for patient safety and medication relief.

## 2014-10-24 NOTE — H&P (Signed)
Psychiatric Admission Assessment Adult  Patient Identification: Robin Aguirre MRN:  [id number] Date of Evaluation:  10/24/2014 Chief Complaint:   " I am depressed ".  Principal Diagnosis:  MDD without psychotic features  Diagnosis:   Patient Active Problem List   Diagnosis Date Noted  . MDD (major depressive disorder), recurrent severe, without psychosis [F33.2] 10/23/2014  . Headache disorder [R51] 09/14/2014  . Dizzy [R42] 09/14/2014  . Blurry vision, bilateral [H53.8] 09/14/2014  . Pulsatile tinnitus of both ears [H93.13] 09/14/2014  . Perceived hearing changes [H90.5] 09/14/2014  . Vision, loss, sudden [H53.139] 09/14/2014   History of Present Illness:: 24 year old female. Reports worsening depression  , particularly over the last two months or so, with suicidal ideations , described as passive thoughts of " being  Better off dead ", as well as anhedonia and feelings of worthlessness. .  Denies any actual plans or intention of SI. States she spoke with her father about her worsening depression, and he advised her to come to hospital .  States her depression has  Been related to worsening headaches , which she states started earlier this  Year.  States these headaches have become chronic and can be hemi-cranial or global. Has seen a Neurologist and states work up has included CT Scan, MRI, and LP , all of which have been negative .  States she had been tried on Topamax ( ?- no completely sure if it was this medication ) and more recently Nortriptyline , but does not feel they helped and so she stopped them three to four weeks ago.  Has not been taking any medications recently except for Unisom ( OTC ) for insomnia.  Elements:  Worsening depression , currently severe, with (+) SI, related to worsening headaches, which are chronic . Associated Signs/Symptoms: Depression Symptoms:  depressed mood, anhedonia, insomnia, feelings of worthlessness/guilt, suicidal thoughts without  plan, anxiety, panic attacks, loss of energy/fatigue, (Hypo) Manic Symptoms:   Denies  Anxiety Symptoms:   Reports significant anxiety. States she worries excessively and also has panic attacks . Psychotic Symptoms:  denies  PTSD Symptoms: Denies  Total Time spent with patient: 45 minutes   Past Psychiatric History-denies prior psychiatric admissions, remote history of self cutting as teenager " only for a couple of months", no history of psychosis, no history of violence. One prior episode of depression related to break up at the time. Long history of panic attacks, which she attributes to pan . No agoraphobia, no social phobia endorsed. Does  Not endorse PTSD.  No prior psychiatric medications other than as above . Not currently on any medications.  Past Medical History: NKDA, smokes 2 cigarettes per day. Neurologist is Dr. Jaynee Eagles .  Past Medical History  Diagnosis Date  . GERD (gastroesophageal reflux disease)   . Colitis, chronic, ulcerative   . Headache     Past Surgical History  Procedure Laterality Date  . Colonoscopy  January 2016  . Removal of iud    . Wisdom tooth extraction  March 2016   Family History: Parents alive, divorced, mother in [place], father lives locally- she is very close to her father. Has 2 brothers and one sister. Father has history of depression and PTSD. Father has attempted suicide. States that there is a strong history of alcohol dependence in extended family. Family History  Problem Relation Age of Onset  . Cancer Maternal Grandmother   . Migraines Neg Hx    Social History:  Divorced, has supportive BF,  Lives with her grandparents, has two children, ages 22 and 83, currently under the care of grandparents. Employed, but has  Been taking time off work due to headaches. Denies legal issues .  History  Alcohol Use No     History  Drug Use  . Yes  . Special: Benzodiazepines, Marijuana    Comment: Stopped smoking marijuana on 08/17/14    History    Social History  . Marital Status: Single    Spouse Name: N/A  . Number of Children: 0  . Years of Education: N/A   Social History Main Topics  . Smoking status: Former Smoker -- 0.25 packs/day for 10 years    Types: Cigarettes  . Smokeless tobacco: Not on file  . Alcohol Use: No  . Drug Use: Yes    Special: Benzodiazepines, Marijuana     Comment: Stopped smoking marijuana on 08/17/14  . Sexual Activity: Yes    Birth Control/ Protection: Implant   Other Topics Concern  . None   Social History Narrative   Lives at home with grandparents and children.    Caffeine use: Drinks a few sodas a few times per week    Additional Social History:  Musculoskeletal: Strength & Muscle Tone: within normal limits Gait & Station: normal Patient leans: N/A  Psychiatric Specialty Exam: Physical Exam  Review of Systems  Constitutional: Negative.   Eyes: Negative.   Respiratory: Negative.   Cardiovascular: Negative.   Gastrointestinal: Negative for heartburn, vomiting, diarrhea and blood in stool.  Genitourinary: Negative.   Musculoskeletal: Negative.   Skin:       History of psoriasis   Neurological: Positive for dizziness and headaches. Negative for seizures.  Endo/Heme/Allergies: Negative.   Psychiatric/Behavioral: Positive for depression and suicidal ideas. The patient is nervous/anxious.   all other systems negative   Blood pressure 104/70, pulse 104, temperature 98 F (36.7 C), temperature source Oral, resp. rate 16, height '5\' 6"'  (1.676 m), weight 187 lb (84.823 kg).Body mass index is 30.2 kg/(m^2).  General Appearance: Fairly Groomed  Engineer, water::  Good  Speech:  Normal Rate  Volume:  Normal  Mood:  depressed, but states she is feeling better   Affect:  Constricted and but reactive   Thought Process:  Linear  Orientation:  Other:  fully alert and attenitve   Thought Content:  no hallucinations, no delusions   Suicidal Thoughts:  Yes.  without intent/plan- denies any  current plan or intention of hurting self or of SI.   Homicidal Thoughts:  No  Memory:  recent and remote grossly intact  Judgement:  Fair  Insight:  Present  Psychomotor Activity:  Normal  Concentration:  Good  Recall:  Good  Fund of Knowledge:Good  Language: Good  Akathisia:  Negative  Handed:  Right  AIMS (if indicated):     Assets:  Desire for Improvement Physical Health Resilience  ADL's:  Fair   Cognition: WNL  Sleep:  Number of Hours: 5.75   Risk to Self: Is patient at risk for suicide?: Yes What has been your use of drugs/alcohol within the last 12 months?: uses THC 1-2 times a month Risk to Others:   Prior Inpatient Therapy:   Prior Outpatient Therapy:    Alcohol Screening: 1. How often do you have a drink containing alcohol?: Monthly or less 2. How many drinks containing alcohol do you have on a typical day when you are drinking?: 1 or 2 3. How often do you have six or more drinks on  one occasion?: Never Preliminary Score: 0 4. How often during the last year have you found that you were not able to stop drinking once you had started?: Never 5. How often during the last year have you failed to do what was normally expected from you becasue of drinking?: Never 6. How often during the last year have you needed a first drink in the morning to get yourself going after a heavy drinking session?: Never 7. How often during the last year have you had a feeling of guilt of remorse after drinking?: Never 8. How often during the last year have you been unable to remember what happened the night before because you had been drinking?: Never 9. Have you or someone else been injured as a result of your drinking?: No 10. Has a relative or friend or a doctor or another health worker been concerned about your drinking or suggested you cut down?: No Alcohol Use Disorder Identification Test Final Score (AUDIT): 1 Brief Intervention: AUDIT score less than 7 or less-screening does not  suggest unhealthy drinking-brief intervention not indicated  Allergies:  No Known Allergies Lab Results:  Results for orders placed or performed during the hospital encounter of 10/23/14 (from the past 48 hour(s))  Urine rapid drug screen (hosp performed) (Not at [facility])     Status: Abnormal   Collection Time: 10/23/14  3:15 PM  Result Value Ref Range   Opiates NONE DETECTED NONE DETECTED   Cocaine NONE DETECTED NONE DETECTED   Benzodiazepines POSITIVE (A) NONE DETECTED   Amphetamines NONE DETECTED NONE DETECTED   Tetrahydrocannabinol NONE DETECTED NONE DETECTED   Barbiturates NONE DETECTED NONE DETECTED    Comment:        DRUG SCREEN FOR MEDICAL PURPOSES ONLY.  IF CONFIRMATION IS NEEDED FOR ANY PURPOSE, NOTIFY LAB WITHIN 5 DAYS.        LOWEST DETECTABLE LIMITS FOR URINE DRUG SCREEN Drug Class       Cutoff (ng/mL) Amphetamine      1000 Barbiturate      200 Benzodiazepine   920 Tricyclics       100 Opiates          300 Cocaine          300 THC              50   Comprehensive metabolic panel     Status: Abnormal   Collection Time: 10/23/14  3:20 PM  Result Value Ref Range   Sodium 138 135 - 145 mmol/L   Potassium 3.7 3.5 - 5.1 mmol/L   Chloride 109 101 - 111 mmol/L   CO2 23 22 - 32 mmol/L   Glucose, Bld 108 (H) 65 - 99 mg/dL   BUN 8 6 - 20 mg/dL   Creatinine, Ser 0.63 0.44 - 1.00 mg/dL   Calcium 8.8 (L) 8.9 - 10.3 mg/dL   Total Protein 7.5 6.5 - 8.1 g/dL   Albumin 4.2 3.5 - 5.0 g/dL   AST 43 (H) 15 - 41 U/L   ALT 36 14 - 54 U/L   Alkaline Phosphatase 59 38 - 126 U/L   Total Bilirubin 0.7 0.3 - 1.2 mg/dL   GFR calc non Af Amer >60 >60 mL/min   GFR calc Af Amer >60 >60 mL/min    Comment: (NOTE) The eGFR has been calculated using the CKD EPI equation. This calculation has not been validated in all clinical situations. eGFR's persistently <60 mL/min signify possible Chronic Kidney Disease.  Anion gap 6 5 - 15  Ethanol (ETOH)     Status: None   Collection Time:  10/23/14  3:20 PM  Result Value Ref Range   Alcohol, Ethyl (B) <5 <5 mg/dL    Comment:        LOWEST DETECTABLE LIMIT FOR SERUM ALCOHOL IS 5 mg/dL FOR MEDICAL PURPOSES ONLY   Salicylate level     Status: None   Collection Time: 10/23/14  3:20 PM  Result Value Ref Range   Salicylate Lvl <7.3 2.8 - 30.0 mg/dL  Acetaminophen level     Status: Abnormal   Collection Time: 10/23/14  3:20 PM  Result Value Ref Range   Acetaminophen (Tylenol), Serum <10 (L) 10 - 30 ug/mL    Comment:        THERAPEUTIC CONCENTRATIONS VARY SIGNIFICANTLY. A RANGE OF 10-30 ug/mL MAY BE AN EFFECTIVE CONCENTRATION FOR MANY PATIENTS. HOWEVER, SOME ARE BEST TREATED AT CONCENTRATIONS OUTSIDE THIS RANGE. ACETAMINOPHEN CONCENTRATIONS >150 ug/mL AT 4 HOURS AFTER INGESTION AND >50 ug/mL AT 12 HOURS AFTER INGESTION ARE OFTEN ASSOCIATED WITH TOXIC REACTIONS.   CBC     Status: None   Collection Time: 10/23/14  3:20 PM  Result Value Ref Range   WBC 8.2 4.0 - 10.5 K/uL    Comment: WHITE COUNT CONFIRMED ON SMEAR   RBC 4.43 3.87 - 5.11 MIL/uL   Hemoglobin 13.2 12.0 - 15.0 g/dL   HCT 38.6 36.0 - 46.0 %   MCV 87.1 78.0 - 100.0 fL   MCH 29.8 26.0 - 34.0 pg   MCHC 34.2 30.0 - 36.0 g/dL   RDW 12.3 11.5 - 15.5 %   Platelets 308 150 - 400 K/uL   Current Medications: Current Facility-Administered Medications  Medication Dose Route Frequency Provider Last Rate Last Dose  . acetaminophen (TYLENOL) tablet 650 mg  650 mg Oral Q6H PRN Gypsy Lore, NP   650 mg at 10/23/14 2337  . alum & mag hydroxide-simeth (MAALOX/MYLANTA) 200-200-20 MG/5ML suspension 30 mL  30 mL Oral Q4H PRN Evanna Burkett, NP      . hydrOXYzine (ATARAX/VISTARIL) tablet 25 mg  25 mg Oral Q6H PRN Gypsy Lore, NP   25 mg at 10/23/14 2220  . magnesium hydroxide (MILK OF MAGNESIA) suspension 30 mL  30 mL Oral Daily PRN Evanna Burkett, NP      . traZODone (DESYREL) tablet 50 mg  50 mg Oral QHS,MR X 1 Evanna Burkett, NP   50 mg at 10/24/14 0024   PTA  Medications: Prescriptions prior to admission  Medication Sig Dispense Refill Last Dose  . doxylamine, Sleep, (UNISOM) 25 MG tablet Take 100 mg by mouth at bedtime as needed for sleep.   Past Week at Unknown time  . topiramate (TOPAMAX) 25 MG tablet Take 1 tablet (25 mg total) by mouth 2 (two) times daily. 60 tablet 3 Past Month at Unknown time    Previous Psychotropic Medications:  Prior history of cannabis dependence, but states she stopped using regularly years ago and currently smokes irregularly. History of abusing Ecstasy, last used 10 years ago. Denies history of alcohol abuse . No IVDA. Denies BZD Abuse, states she took one on one or two occasions for insomnia .  Substance Abuse History in the last 12 months:  No.- other than as above .     Consequences of Substance Abuse: Negative  Results for orders placed or performed during the hospital encounter of 10/23/14 (from the past 72 hour(s))  Urine rapid drug screen (hosp performed) (  Not at [facility])     Status: Abnormal   Collection Time: 10/23/14  3:15 PM  Result Value Ref Range   Opiates NONE DETECTED NONE DETECTED   Cocaine NONE DETECTED NONE DETECTED   Benzodiazepines POSITIVE (A) NONE DETECTED   Amphetamines NONE DETECTED NONE DETECTED   Tetrahydrocannabinol NONE DETECTED NONE DETECTED   Barbiturates NONE DETECTED NONE DETECTED    Comment:        DRUG SCREEN FOR MEDICAL PURPOSES ONLY.  IF CONFIRMATION IS NEEDED FOR ANY PURPOSE, NOTIFY LAB WITHIN 5 DAYS.        LOWEST DETECTABLE LIMITS FOR URINE DRUG SCREEN Drug Class       Cutoff (ng/mL) Amphetamine      1000 Barbiturate      200 Benzodiazepine   093 Tricyclics       267 Opiates          300 Cocaine          300 THC              50   Comprehensive metabolic panel     Status: Abnormal   Collection Time: 10/23/14  3:20 PM  Result Value Ref Range   Sodium 138 135 - 145 mmol/L   Potassium 3.7 3.5 - 5.1 mmol/L   Chloride 109 101 - 111 mmol/L   CO2 23 22 - 32 mmol/L    Glucose, Bld 108 (H) 65 - 99 mg/dL   BUN 8 6 - 20 mg/dL   Creatinine, Ser 0.63 0.44 - 1.00 mg/dL   Calcium 8.8 (L) 8.9 - 10.3 mg/dL   Total Protein 7.5 6.5 - 8.1 g/dL   Albumin 4.2 3.5 - 5.0 g/dL   AST 43 (H) 15 - 41 U/L   ALT 36 14 - 54 U/L   Alkaline Phosphatase 59 38 - 126 U/L   Total Bilirubin 0.7 0.3 - 1.2 mg/dL   GFR calc non Af Amer >60 >60 mL/min   GFR calc Af Amer >60 >60 mL/min    Comment: (NOTE) The eGFR has been calculated using the CKD EPI equation. This calculation has not been validated in all clinical situations. eGFR's persistently <60 mL/min signify possible Chronic Kidney Disease.    Anion gap 6 5 - 15  Ethanol (ETOH)     Status: None   Collection Time: 10/23/14  3:20 PM  Result Value Ref Range   Alcohol, Ethyl (B) <5 <5 mg/dL    Comment:        LOWEST DETECTABLE LIMIT FOR SERUM ALCOHOL IS 5 mg/dL FOR MEDICAL PURPOSES ONLY   Salicylate level     Status: None   Collection Time: 10/23/14  3:20 PM  Result Value Ref Range   Salicylate Lvl <1.2 2.8 - 30.0 mg/dL  Acetaminophen level     Status: Abnormal   Collection Time: 10/23/14  3:20 PM  Result Value Ref Range   Acetaminophen (Tylenol), Serum <10 (L) 10 - 30 ug/mL    Comment:        THERAPEUTIC CONCENTRATIONS VARY SIGNIFICANTLY. A RANGE OF 10-30 ug/mL MAY BE AN EFFECTIVE CONCENTRATION FOR MANY PATIENTS. HOWEVER, SOME ARE BEST TREATED AT CONCENTRATIONS OUTSIDE THIS RANGE. ACETAMINOPHEN CONCENTRATIONS >150 ug/mL AT 4 HOURS AFTER INGESTION AND >50 ug/mL AT 12 HOURS AFTER INGESTION ARE OFTEN ASSOCIATED WITH TOXIC REACTIONS.   CBC     Status: None   Collection Time: 10/23/14  3:20 PM  Result Value Ref Range   WBC 8.2 4.0 - 10.5 K/uL  Comment: WHITE COUNT CONFIRMED ON SMEAR   RBC 4.43 3.87 - 5.11 MIL/uL   Hemoglobin 13.2 12.0 - 15.0 g/dL   HCT 38.6 36.0 - 46.0 %   MCV 87.1 78.0 - 100.0 fL   MCH 29.8 26.0 - 34.0 pg   MCHC 34.2 30.0 - 36.0 g/dL   RDW 12.3 11.5 - 15.5 %   Platelets 308 150 -  400 K/uL    Observation Level/Precautions:  15 minute checks  Laboratory:   Check pregnancy test , check TSH  Psychotherapy:  Groups, milieu  Medications:  Start Cymbalta 30 mgrs QAM for depression. Side effects reviewed. Ambien PRNS for insomnia ( D/C Trazodone as not helping and may be contributing to headaches )    Consultations:  As needed   Discharge Concerns:    Estimated LOS: 4-5 days   Other:     Psychological Evaluations:  No   Treatment Plan Summary: Daily contact with patient to assess and evaluate symptoms and progress in treatment, Medication management, Plan inpatient admission and medications as above   Medical Decision Making:  Review of Psycho-Social Stressors (1), Review or order clinical lab tests (1), Established Problem, Worsening (2) and Review of New Medication or Change in Dosage (2)  I certify that inpatient services furnished can reasonably be expected to improve the patient's condition.   Neita Garnet 8/8/201612:07 PM

## 2014-10-24 NOTE — Plan of Care (Signed)
Problem: Ineffective individual coping Goal: STG: Patient will remain free from self harm Outcome: Progressing Patient remains free from self harm. 15 minute checks continued per protocol for patient safety.   Problem: Diagnosis: Increased Risk For Suicide Attempt Goal: STG-Patient Will Attend All Groups On The Unit Outcome: Progressing Patient is attending unit groups today. Goal: STG-Patient Will Comply With Medication Regime Outcome: Progressing Patient has adhered to medication regimen today with ease.  Problem: Alteration in mood Goal: LTG-Patient reports reduction in suicidal thoughts (Patient reports reduction in suicidal thoughts and is able to verbalize a safety plan for whenever patient is feeling suicidal)  Outcome: Progressing Patient denies having suicidal thoughts today.

## 2014-10-25 LAB — TSH: TSH: 1.902 u[IU]/mL (ref 0.350–4.500)

## 2014-10-25 MED ORDER — DIVALPROEX SODIUM ER 250 MG PO TB24
250.0000 mg | ORAL_TABLET | Freq: Once | ORAL | Status: AC
  Administered 2014-10-25: 250 mg via ORAL
  Filled 2014-10-25 (×2): qty 1

## 2014-10-25 MED ORDER — DULOXETINE HCL 20 MG PO CPEP
40.0000 mg | ORAL_CAPSULE | Freq: Every day | ORAL | Status: DC
Start: 2014-10-26 — End: 2014-10-26
  Administered 2014-10-26: 40 mg via ORAL
  Filled 2014-10-25 (×3): qty 2

## 2014-10-25 MED ORDER — IBUPROFEN 400 MG PO TABS
400.0000 mg | ORAL_TABLET | Freq: Four times a day (QID) | ORAL | Status: DC | PRN
  Administered 2014-10-25 – 2014-10-26 (×2): 400 mg via ORAL
  Filled 2014-10-25 (×3): qty 1

## 2014-10-25 MED ORDER — DIVALPROEX SODIUM ER 250 MG PO TB24
250.0000 mg | ORAL_TABLET | Freq: Two times a day (BID) | ORAL | Status: DC
  Administered 2014-10-26: 250 mg via ORAL
  Filled 2014-10-25 (×4): qty 1

## 2014-10-25 NOTE — Progress Notes (Signed)
D: Robin Aguirre is very anxious and irritated. She can not identify any triggers to her current mood. Rates anxiety 10/10. States she wants to just hit somebody right now. She denies SI/HI/AVH. She was able to speak with Dr. Jama Flavors briefly regarding her current mood and new orders were given. She states she has been moody and irritable most of the day and woke up in this state. I encouraged her to utilize the coping skills she has learned since she has been here. She states she will practice her deep breathing and see if that works. She is requesting for me to provide her with some form of distraction (reading materials, coloring books, writing paper) to take her mind off how "irritated I am feeling".  A: Medications administered per orders. Provided Robin Aguirre with [place] sheets to provide an outlet for distraction. Provided encouragement and support through discussion with patient.  R: Will continue to monitor for patient safety and medication effectiveness.

## 2014-10-25 NOTE — Plan of Care (Signed)
Problem: Alteration in mood Goal: LTG-Patient reports reduction in suicidal thoughts (Patient reports reduction in suicidal thoughts and is able to verbalize a safety plan for whenever patient is feeling suicidal)  Outcome: Progressing Patient denies having suicidal thoughts today.

## 2014-10-25 NOTE — Progress Notes (Signed)
Recreation Therapy Notes  Animal-Assisted Activity (AAA) Program Checklist/Progress Notes Patient Eligibility Criteria Checklist & Daily Group note for Rec Tx Intervention  Date: 08.09.16 Time: 2:45 pm Location: [address]  AAA/T Program Assumption of Risk Form signed by Patient/ or Parent Legal Guardian yes  Patient is free of allergies or sever asthma yes  Patient reports no fear of animals yes  Patient reports no history of cruelty to animalsyes  Patient understands his/her participation is voluntary yes  Patient washes hands before animal contact yes  Patient washes hands after animal contact yes  Education: Hand Washing, Appropriate Animal Interaction   Education Outcome: Acknowledges understanding/In group clarification offered/Needs additional education.   Clinical Observations/Feedback: Patient did not attend group.   Robin Aguirre, LRT/CTRS         Robin Aguirre A 10/25/2014 4:30 PM

## 2014-10-25 NOTE — Plan of Care (Signed)
Problem: Alteration in mood Goal: STG-Patient is able to discuss feelings and issues (Patient is able to discuss feelings and issues leading to depression)  Outcome: Progressing Pt discusses migraines and depression with Clinical research associate

## 2014-10-25 NOTE — Progress Notes (Signed)
[facility] MD Progress Note  10/25/2014 6:49 PM Robin Aguirre  MRN:  [id number] Subjective:  Patient reports feeling depressed and vaguely irritable. States she is experiencing migraine headache, which she describes as hemi- cranial, with some photophobia and nausea.  Denies medication side effects. Objective : I have discussed case with treatment team and have met with patient. She presents depressed, dysphoric, slightly irritable. States she has a migraine today. Insomnia reported as an ongoing issue, but did sleep partially better on Ambien, Which she states she has taken before with no side effects. Denies SI, states she remains hopeful that symptoms will improve . Responds partially to support, encouragement. Affect did improve during session, and she became more communicative and sat up in bed as session progressed . Does not appear restless or in acute distress . Patient states she has tried Topamax and Nortriptyline with little results. Has not been on Depakote ER  Or on B blocker ( as migraine prophylaxis)  No disruptive behaviors on unit, tends to remain isolated in room when having a headache.  Of note, pregnancy test negative , TSH WNL.  Principal Problem: MDD (major depressive disorder), recurrent severe, without psychosis Diagnosis:   Patient Active Problem List   Diagnosis Date Noted  . MDD (major depressive disorder), recurrent severe, without psychosis [F33.2] 10/23/2014  . Headache disorder [R51] 09/14/2014  . Dizzy [R42] 09/14/2014  . Blurry vision, bilateral [H53.8] 09/14/2014  . Pulsatile tinnitus of both ears [H93.13] 09/14/2014  . Perceived hearing changes [H90.5] 09/14/2014  . Vision, loss, sudden [H53.139] 09/14/2014   Total Time spent with patient: 20 minutes   Past Medical History:  Past Medical History  Diagnosis Date  . GERD (gastroesophageal reflux disease)   . Colitis, chronic, ulcerative   . Headache     Past Surgical History  Procedure Laterality Date  .  Colonoscopy  January 2016  . Removal of iud    . Wisdom tooth extraction  March 2016   Family History:  Family History  Problem Relation Age of Onset  . Cancer Maternal Grandmother   . Migraines Neg Hx    Social History:  History  Alcohol Use No     History  Drug Use  . Yes  . Special: Benzodiazepines, Marijuana    Comment: Stopped smoking marijuana on 08/17/14    History   Social History  . Marital Status: Single    Spouse Name: N/A  . Number of Children: 0  . Years of Education: N/A   Social History Main Topics  . Smoking status: Former Smoker -- 0.25 packs/day for 10 years    Types: Cigarettes  . Smokeless tobacco: Not on file  . Alcohol Use: No  . Drug Use: Yes    Special: Benzodiazepines, Marijuana     Comment: Stopped smoking marijuana on 08/17/14  . Sexual Activity: Yes    Birth Control/ Protection: Implant   Other Topics Concern  . None   Social History Narrative   Lives at home with grandparents and children.    Caffeine use: Drinks a few sodas a few times per week    Additional History:    Sleep:  Improved   Appetite:  Fair   Assessment:   Musculoskeletal: Strength & Muscle Tone: within normal limits Gait & Station: normal Patient leans: N/A   Psychiatric Specialty Exam: Physical Exam  ROS (+) headache, no vomiting   Blood pressure 108/69, pulse 97, temperature 98.6 F (37 C), temperature source Oral, resp. rate 16, height  '5\' 6"'  (1.676 m), weight 187 lb (84.823 kg).Body mass index is 30.2 kg/(m^2).  General Appearance: Fairly Groomed  Engineer, water::  Fair  Speech:  Normal Rate  Volume:  Normal  Mood:  Depressed and Dysphoric  Affect:  Congruent  Thought Process:  Linear  Orientation:  Full (Time, Place, and Person)  Thought Content:  no hallucinations, no delusions  Suicidal Thoughts:  No- denies any current thoughts of hurting self or of SI  Homicidal Thoughts:  No  Memory:  recent and remote grossly intact   Judgement:  Fair   Insight:  Present  Psychomotor Activity:  Decreased  Concentration:  Good  Recall:  Good  Fund of Knowledge:Good  Language: Good  Akathisia:  Negative  Handed:  Right  AIMS (if indicated):     Assets:  Communication Skills Desire for Improvement Resilience  ADL's:  Fair   Cognition: WNL  Sleep:  Number of Hours: 6.75     Current Medications: Current Facility-Administered Medications  Medication Dose Route Frequency Provider Last Rate Last Dose  . acetaminophen (TYLENOL) tablet 650 mg  650 mg Oral Q6H PRN Gypsy Lore, NP   650 mg at 10/25/14 0829  . alum & mag hydroxide-simeth (MAALOX/MYLANTA) 200-200-20 MG/5ML suspension 30 mL  30 mL Oral Q4H PRN Evanna Burkett, NP   30 mL at 10/24/14 1957  . DULoxetine (CYMBALTA) DR capsule 30 mg  30 mg Oral Daily Jenne Campus, MD   30 mg at 10/25/14 0829  . hydrOXYzine (ATARAX/VISTARIL) tablet 25 mg  25 mg Oral Q6H PRN Gypsy Lore, NP   25 mg at 10/24/14 2032  . magnesium hydroxide (MILK OF MAGNESIA) suspension 30 mL  30 mL Oral Daily PRN Evanna Burkett, NP      . zolpidem (AMBIEN) tablet 5 mg  5 mg Oral QHS PRN Jenne Campus, MD   5 mg at 10/24/14 2112    Lab Results:  Results for orders placed or performed during the hospital encounter of 10/23/14 (from the past 48 hour(s))  Pregnancy, urine     Status: None   Collection Time: 10/24/14  7:29 PM  Result Value Ref Range   Preg Test, Ur NEGATIVE NEGATIVE    Comment:        THE SENSITIVITY OF THIS METHODOLOGY IS >20 mIU/mL. Performed at [facility]   TSH     Status: None   Collection Time: 10/25/14  6:44 AM  Result Value Ref Range   TSH 1.902 0.350 - 4.500 uIU/mL    Comment: Performed at [facility]    Physical Findings: AIMS:  , ,  ,  ,    CIWA:    COWS:      Assessment- patient experiencing ongoing dysphoria, depression, which she attributes to chronic headaches . Describes headaches as migraines. She is not suicidal. She is  tolerating Cymbalta well.   She reports fair sleep- insomnia is a chronic problem - but did sleep better on Ambien, and denies side effects.   Treatment Plan Summary: Daily contact with patient to assess and evaluate symptoms and progress in treatment, Medication management, Plan inpatient treatment and medications as below Continue Ambien 5 mgrs QHS PRN for insomnia Increase Cymbalta to 40 mgrs QDAY for Depression  Start Depakote ER 250 mgrs BID as  Migraine prophylaxis strategy Ibuprofen PRNs for Headaches. Would minimize use of triptans at this time due to potential interactions with antidepressant   Medical Decision Making:  Established Problem, Stable/Improving (1),  Review of Psycho-Social Stressors (1), Review or order clinical lab tests (1), Review of Medication Regimen & Side Effects (2) and Review of New Medication or Change in Dosage (2)     Anyelina Claycomb 10/25/2014, 6:49 PM

## 2014-10-25 NOTE — Progress Notes (Signed)
Patient ID: Robin Aguirre, female   DOB: 28-May-1990, 24 y.o.   MRN: [id number] Adult Psychoeducational Group Note  Date:  10/25/2014 Time: 09:00am  Group Topic/Focus:  Goals Group:   The focus of this group is to help patients establish daily goals to achieve during treatment and discuss how the patient can incorporate goal setting into their daily lives to aide in recovery.  Participation Level:  Active  Participation Quality:  Appropriate, Attentive and Sharing  Affect:  Flat  Cognitive:  Alert and Appropriate  Insight: Improving  Engagement in Group:  Engaged and Supportive  Modes of Intervention:  Activity, Education, Orientation and Support  Additional Comments:  Pt able to identify one goal to accomplish in her recovery.   [name] 10/25/2014, 11:30 AM

## 2014-10-25 NOTE — Tx Team (Signed)
Interdisciplinary Treatment Plan Update (Adult) Date: 10/25/2014   Date: 10/25/2014 11:56 AM  Progress in Treatment:  Attending groups: Yes  Participating in groups: Yes  Taking medication as prescribed: Yes  Tolerating medication: Yes  Family/Significant othe contact made: Yes, with father Patient understands diagnosis: Yes Discussing patient identified problems/goals with staff: Yes  Medical problems stabilized or resolved: Yes  Denies suicidal/homicidal ideation: Yes Patient has not harmed self or Others: Yes   New problem(s) identified: None identified at this time.   Discharge Plan or Barriers: Pt will return to her grandparent's home and follow-up with [facility].  Additional comments: n/a   Reason for Continuation of Hospitalization:  Depression Medication stabilization Suicidal ideation  Estimated length of stay: 3-5 days  Review of initial/current patient goals per problem list:   1.  Goal(s): Patient will participate in aftercare plan  Met:  Yes  Target date: 3-5 days from date of admission   As evidenced by: Patient will participate within aftercare plan AEB aftercare provider and housing plan at discharge being identified.   10/25/14: Pt will return home and follow-up with [facility]  2.  Goal (s): Patient will exhibit decreased depressive symptoms and suicidal ideations.  Met:  Goal progressing  Target date: 3-5 days from date of admission   As evidenced by: Patient will utilize self rating of depression at 3 or below and demonstrate decreased signs of depression or be deemed stable for discharge by MD.  10/25/14: Pt rates depression at 4/10, reports improving mood and presents with appropriate affect. Pt engaging in group therapy and interacting well with peers.   Attendees:  Patient:    Family:    Physician: Dr. Parke Poisson, MD  10/25/2014 9:02 AM  Nursing: [name], RN Case manager  10/25/2014 9:02 AM  Clinical Social Worker  [name], MSW 10/25/2014 9:02 AM  Other: [name], [name] 10/25/2014 9:02 AM  Clinical:  [name], RN; [name], RN 10/25/2014 9:02 AM  Other: [name], RN Charge Nurse 10/25/2014 9:02 AM  Other:      [name], [name] MSW

## 2014-10-25 NOTE — Progress Notes (Signed)
Patient ID: Robin Aguirre, female   DOB: 08/03/1990, 24 y.o.   MRN: [id number]   Pt currently presents with a flat affect and depressed behavior. Per self inventory, pt rates depression at a 7, hopelessness 0 and anxiety 2. Pt's daily goal is to "getting up to eat and do group" and they intend to do so by "just push through it." Pt reports poor sleep, a good appetite, low energy and poor concentration.  Pt provided with medications per providers orders. Pt's labs and vitals were monitored throughout the day. Pt supported emotionally and encouraged to express concerns and questions. Pt educated on medications.  Pt's safety ensured with 15 minute and environmental checks. Pt currently denies SI/HI and A/V hallucinations. Pt verbally agrees to seek staff if SI/HI or A/VH occurs and to consult with staff before acting on these thoughts. Pt remains in bed for most of the day although she remains awake. Pt reports her headache has still been bothering her. Will continue POC.

## 2014-10-25 NOTE — Progress Notes (Signed)
[facility] Group Notes:  (Nursing/MHT/Case Management/Adjunct)  Date:  10/25/2014  Time:  9:34 PM  Type of Therapy:  Psychoeducational Skills  Participation Level:  Active  Participation Quality:  Appropriate  Affect:  Appropriate  Cognitive:  Alert  Insight:  Limited  Engagement in Group:  Limited  Modes of Intervention:  Discussion  Summary of Progress/Problems: Patient rated her day as a 3 because she did not feel very well. Patient also stated that there was nothing positive that happened today.  Naziya Hegwood L Tywaun Hiltner 10/25/2014, 9:34 PM

## 2014-10-25 NOTE — Plan of Care (Signed)
Problem: Alteration in mood; excessive anxiety as evidenced by: Goal: STG-Pt can identify coping skills to manage panic/anxiety (Patient can identify at least ____ coping skills to manage panic/anxiety attack)  Outcome: Progressing Robin Aguirre is using deep breathing and distraction techniques such as playing Sodoku to relieve her anxiety.

## 2014-10-26 MED ORDER — DULOXETINE HCL 60 MG PO CPEP
60.0000 mg | ORAL_CAPSULE | Freq: Every day | ORAL | Status: DC
  Administered 2014-10-27 – 2014-10-28 (×2): 60 mg via ORAL
  Filled 2014-10-26 (×2): qty 1
  Filled 2014-10-26: qty 3
  Filled 2014-10-26: qty 1

## 2014-10-26 MED ORDER — LAMOTRIGINE 25 MG PO TABS
25.0000 mg | ORAL_TABLET | Freq: Every day | ORAL | Status: DC
  Administered 2014-10-26 – 2014-10-28 (×3): 25 mg via ORAL
  Filled 2014-10-26 (×2): qty 1
  Filled 2014-10-26: qty 3
  Filled 2014-10-26 (×3): qty 1

## 2014-10-26 MED ORDER — ZOLPIDEM TARTRATE 10 MG PO TABS
10.0000 mg | ORAL_TABLET | Freq: Every evening | ORAL | Status: DC | PRN
  Administered 2014-10-26 – 2014-10-27 (×2): 10 mg via ORAL
  Filled 2014-10-26 (×2): qty 1

## 2014-10-26 NOTE — [facility] Group Notes (Signed)
[facility] LCSW Aftercare Discharge Planning Group Note  10/26/2014 8:45 AM  Participation Quality: Alert, Appropriate and Oriented  Mood/Affect: Flat  Depression Rating: 7  Anxiety Rating: 9  Thoughts of Suicide: Pt denies SI/HI  Will you contract for safety? Yes  Current AVH: Pt denies  Plan for Discharge/Comments: Pt attended discharge planning group and actively participated in group. CSW discussed suicide prevention education with the group and encouraged them to discuss discharge planning and any relevant barriers. Pt reports "not doing good" this morning. She states that she would like to stay one more night because she is starting a new medication. Pt continues to report no sleep and increased depression and anxiety.  Transportation Means: Pt reports access to transportation  Supports: Grandparents, father, mother  Chad Cordial, Theresia Majors 10/26/2014 9:15 AM

## 2014-10-26 NOTE — Progress Notes (Signed)
Patient ID: Robin Aguirre, female   DOB: 06-18-1990, 24 y.o.   MRN: [id number] D:pt smiling on approach reports " I'm in a good mood. I have a headache but I trying to push through it". Pt stated she feel optimistic and will be following up with her neurologist after discharge. Pt reports she is tolerating medication well. Pt denies SI/HI/AVH. Pt attended and participated in evening wrap up group. Cooperative with assessment. No acute distressed noted at this time.   A: Met with pt 1:1. Medications administered as prescribed. Support and encouragement provided. Pt encouraged to discuss feelings and come to staff with any question or concerns.   R: Patient remains safe is safe and complaint with medications.

## 2014-10-26 NOTE — Progress Notes (Signed)
Patient somewhat isolative this morning. Did attend group with prompting. Patient anxious in affect with congruent mood. Continues to complain of minimal sleep and of poor quality. Rates her depression at an 8/10 and hopelessness both at an 8/10 and anxiety at a 10/10. Reports her goal is to "learn coping to feel better." Patient complained of a headache of an 8/10 before lunch. Patient medicated per orders. Advil given for pain. Support offered. Discussed patient's care with MD, tx team. Will reassess pain in one hour. Patient denies SI/HI and remains safe. Lawrence Marseilles

## 2014-10-26 NOTE — Progress Notes (Signed)
Adult Psychoeducational Group Note  Date:  10/26/2014 Time:  8:38 PM  Group Topic/Focus:  Wrap-Up Group:   The focus of this group is to help patients review their daily goal of treatment and discuss progress on daily workbooks.  Participation Level:  Active  Participation Quality:  Appropriate  Affect:  Appropriate  Cognitive:  Appropriate  Insight: Appropriate  Engagement in Group:  Engaged  Modes of Intervention:  Discussion  Additional Comments:  Patient rated her day a 9. Goal is to cope with anxiety. Goal is to find something productive to do and interact more.  Natasha Mead 10/26/2014, 8:38 PM

## 2014-10-26 NOTE — Progress Notes (Signed)
Recreation Therapy Notes  Date: 08.10.16 Time: 930 am Location: [address] Group Room  Group Topic: Stress Management  Goal Area(s) Addresses:  Patient will verbalize importance of using healthy stress management.  Patient will identify positive emotions associated with healthy stress management.   Intervention: Stress Management  Activity : Progressive Muscle Relaxation. LRT will introduce and instruct patients on the stress management technique of progressive muscle relaxation. Patients were asked to follow a long with a script read a loud by LRT to participate in the stress management technique of progressive muscle relaxation.  Education: Stress Management, Discharge Planning.   Education Outcome: Acknowledges edcuation/In group clarification offered/Needs additional education  Clinical Observations/Feedback: Patient did not attend group.    Makai Agostinelli, LRT/CTRS         Sherrol Vicars A 10/26/2014 3:53 PM 

## 2014-10-26 NOTE — Progress Notes (Signed)
Patient ID: RIO TABER, female   DOB: 1990-05-24, 24 y.o.   MRN: [id number] [facility] MD Progress Note  10/26/2014 9:10 AM Robin Aguirre  MRN:  [id number] Subjective:  Patient states she continues to have headache, although better today than yesterday. She also states she slept poorly last night, which she thinks is contributing to her headaches .  She does state she feels " a little better".. Objective : I have discussed case with treatment team and have met with patient.  Patient remains mildly dysphoric, but is less irritable /depressed than yesterday. She is up from bed and visible on unit. She is not exhibiting any disruptive or agitated behaviors . She remains focused on insomnia and on her chronic headaches. With her express consent and in her presence, I spoke with Dr. Jaynee Eagles , her Neurologist. We reviewed options , and Dr. Jaynee Eagles suggested Lamictal as  A strategy to prevent migraines and help with depression. Patient agrees to trial- we discussed side effects and rationale, to include risk of severe rash. Although patient denies any current plan of pregnancy, it was felt Depakote, although likely helpful to manage migraines, not first choice at this time, due to potential teratogenicity.  Thus far tolerating Cymbalta and Ambien well. Patient denies any SI and is starting to focus on being discharged soon.  Principal Problem: MDD (major depressive disorder), recurrent severe, without psychosis Diagnosis:   Patient Active Problem List   Diagnosis Date Noted  . MDD (major depressive disorder), recurrent severe, without psychosis [F33.2] 10/23/2014  . Headache disorder [R51] 09/14/2014  . Dizzy [R42] 09/14/2014  . Blurry vision, bilateral [H53.8] 09/14/2014  . Pulsatile tinnitus of both ears [H93.13] 09/14/2014  . Perceived hearing changes [H90.5] 09/14/2014  . Vision, loss, sudden [H53.139] 09/14/2014   Total Time spent with patient: 25 minutes    Past Medical History:  Past Medical  History  Diagnosis Date  . GERD (gastroesophageal reflux disease)   . Colitis, chronic, ulcerative   . Headache     Past Surgical History  Procedure Laterality Date  . Colonoscopy  January 2016  . Removal of iud    . Wisdom tooth extraction  March 2016   Family History:  Family History  Problem Relation Age of Onset  . Cancer Maternal Grandmother   . Migraines Neg Hx    Social History:  History  Alcohol Use No     History  Drug Use  . Yes  . Special: Benzodiazepines, Marijuana    Comment: Stopped smoking marijuana on 08/17/14    Social History   Social History  . Marital Status: Single    Spouse Name: N/A  . Number of Children: 0  . Years of Education: N/A   Social History Main Topics  . Smoking status: Former Smoker -- 0.25 packs/day for 10 years    Types: Cigarettes  . Smokeless tobacco: None  . Alcohol Use: No  . Drug Use: Yes    Special: Benzodiazepines, Marijuana     Comment: Stopped smoking marijuana on 08/17/14  . Sexual Activity: Yes    Birth Control/ Protection: Implant   Other Topics Concern  . None   Social History Narrative   Lives at home with grandparents and children.    Caffeine use: Drinks a few sodas a few times per week    Additional History:    Sleep:  Improved   Appetite:  Fair   Assessment:   Musculoskeletal: Strength & Muscle Tone: within normal limits Gait &  Station: normal Patient leans: N/A   Psychiatric Specialty Exam: Physical Exam  ROS (+) headache,  Improved today, no nausea, no vomiting   Blood pressure 114/78, pulse 94, temperature 98.3 F (36.8 C), temperature source Oral, resp. rate 18, height _0  (1.676 m), weight 187 lb (84.823 kg).Body mass index is 30.2 kg/(m^2).  General Appearance: improved   Eye Contact::  Good  Speech:  Normal Rate  Volume:  Normal  Mood:  improved, less dysphoric   Affect:  Congruent, still constricted, but reactive at times.  Thought Process:  Linear  Orientation:  Full (Time,  Place, and Person)  Thought Content:  no hallucinations, no delusions  Suicidal Thoughts:  No- denies any current thoughts of hurting self or of SI  Homicidal Thoughts:  No  Memory:  recent and remote grossly intact   Judgement:  Fair  Insight:  Present  Psychomotor Activity:  Normal  Concentration:  Good  Recall:  Good  Fund of Knowledge:Good  Language: Good  Akathisia:  Negative  Handed:  Right  AIMS (if indicated):     Assets:  Communication Skills Desire for Improvement Resilience  ADL's:  Fair   Cognition: WNL  Sleep:  Number of Hours: 5.75     Current Medications: Current Facility-Administered Medications  Medication Dose Route Frequency Provider Last Rate Last Dose  . acetaminophen (TYLENOL) tablet 650 mg  650 mg Oral Q6H PRN Gypsy Lore, NP   650 mg at 10/25/14 0829  . alum & mag hydroxide-simeth (MAALOX/MYLANTA) 200-200-20 MG/5ML suspension 30 mL  30 mL Oral Q4H PRN Evanna Burkett, NP   30 mL at 10/24/14 1957  . divalproex (DEPAKOTE ER) 24 hr tablet 250 mg  250 mg Oral BID Jenne Campus, MD   250 mg at 10/26/14 0840  . DULoxetine (CYMBALTA) DR capsule 40 mg  40 mg Oral Daily Jenne Campus, MD   40 mg at 10/26/14 0840  . hydrOXYzine (ATARAX/VISTARIL) tablet 25 mg  25 mg Oral Q6H PRN Gypsy Lore, NP   25 mg at 10/25/14 2113  . ibuprofen (ADVIL,MOTRIN) tablet 400 mg  400 mg Oral Q6H PRN Jenne Campus, MD   400 mg at 10/25/14 2149  . magnesium hydroxide (MILK OF MAGNESIA) suspension 30 mL  30 mL Oral Daily PRN Evanna Burkett, NP      . zolpidem (AMBIEN) tablet 5 mg  5 mg Oral QHS PRN Jenne Campus, MD   5 mg at 10/25/14 2146    Lab Results:  Results for orders placed or performed during the hospital encounter of 10/23/14 (from the past 48 hour(s))  Pregnancy, urine     Status: None   Collection Time: 10/24/14  7:29 PM  Result Value Ref Range   Preg Test, Ur NEGATIVE NEGATIVE    Comment:        THE SENSITIVITY OF THIS METHODOLOGY IS >20  mIU/mL. Performed at [facility]   TSH     Status: None   Collection Time: 10/25/14  6:44 AM  Result Value Ref Range   TSH 1.902 0.350 - 4.500 uIU/mL    Comment: Performed at [facility]    Physical Findings: AIMS:  , ,  ,  ,    CIWA:    COWS:      Assessment- patient partially improved, less dysphoric, less irritable, more visible on unit and better groomed . She continues to report headache, but better than yesterday. Not suicidal. She continues to report insomnia,  only partially alleviated by Ambien. Does state Ambien has helped in the past, and denies any medication side effects.   Treatment Plan Summary: Daily contact with patient to assess and evaluate symptoms and progress in treatment, Medication management, Plan inpatient treatment and medications as below Increase  Ambien  To 10 mgrs  QHS PRN for insomnia Increase Cymbalta to 60 mgrs QDAY for Depression  D/C Depakote- although patient has no current plans of pregnancy, concern about potential teratogenicity should she become pregnant in the future . Start Lamictal 25 mgrs  QAM- for depression and to help prevent migraines . Ibuprofen PRNs for Headaches.  Continue Hydroxyzine PRNs for Anxiety as needed   Medical Decision Making:  Established Problem, Stable/Improving (1), Review of Psycho-Social Stressors (1), Review or order clinical lab tests (1), Review of Medication Regimen & Side Effects (2) and Review of New Medication or Change in Dosage (2)     Robin Aguirre 10/26/2014, 9:10 AM

## 2014-10-26 NOTE — Plan of Care (Signed)
Problem: Diagnosis: Increased Risk For Suicide Attempt Goal: LTG-Patient Will Show Positive Response to Medication LTG (by discharge) : Patient will show positive response to medication and will participate in the development of the discharge plan.  Outcome: Progressing Pt reports she is tolerating medication well reports she is in a good mood.

## 2014-10-26 NOTE — Plan of Care (Signed)
Problem: Ineffective individual coping Goal: STG-Increase in ability to manage activities of daily living Outcome: Progressing Patient up and completing ADLs, caring for self.  Problem: Diagnosis: Increased Risk For Suicide Attempt Goal: STG-Patient Will Report Suicidal Feelings to Staff Outcome: Progressing Patient denying SI

## 2014-10-27 NOTE — [facility] Group Notes (Signed)
[facility] Group Notes:  (Nursing/MHT/Case Management/Adjunct)  Date:  10/27/2014  Time:  10:25 AM  Type of Therapy:  Nurse Education  Participation Level:  Did Not Attend   Loren Racer 10/27/2014, 10:25 AM

## 2014-10-27 NOTE — Progress Notes (Signed)
Patient ID: Robin Aguirre, female   DOB: [birth year], 24 y.o.   MRN: [id number] [facility] MD Progress Note  10/27/2014 9:10 AM Robin Aguirre  MRN:  [id number] Subjective:  Patient states she is feeling better, although " tired today", which she attributes to some ongoing insomnia, although she does state she slept better on increased Ambien dose. Attributes some of her insomnia to  Background /ambient noise, light on unit . States she had a good visit with boyfriend yesterday evening, and states that although she still feels depressed " I am definitely getting better". Headaches are chronic, but better today- describes headache as 3/10 at this time. Objective : I have discussed case with treatment team and have met with patient.  Patient gradually improving - still depressed, but less irritable, less dysphoric, and presenting with a more reactive affect. Also , more positive and hopeful in outlook, expressing some optimism and hope that current medication regimen will work for her. Has been going to some groups, but states that at times prefers to stay in her room with dimmed lighting due to headache, which is associated with photophobia. Thus far tolerating medications well, denies side effects.  .  Principal Problem: MDD (major depressive disorder), recurrent severe, without psychosis Diagnosis:   Patient Active Problem List   Diagnosis Date Noted  . MDD (major depressive disorder), recurrent severe, without psychosis [F33.2] 10/23/2014  . Headache disorder [R51] 09/14/2014  . Dizzy [R42] 09/14/2014  . Blurry vision, bilateral [H53.8] 09/14/2014  . Pulsatile tinnitus of both ears [H93.13] 09/14/2014  . Perceived hearing changes [H90.5] 09/14/2014  . Vision, loss, sudden [H53.139] 09/14/2014   Total Time spent with patient: 20 minutes   Past Medical History:  Past Medical History  Diagnosis Date  . GERD (gastroesophageal reflux disease)   . Colitis, chronic, ulcerative   . Headache      Past Surgical History  Procedure Laterality Date  . Colonoscopy  January 2016  . Removal of iud    . Wisdom tooth extraction  March 2016   Family History:  Family History  Problem Relation Age of Onset  . Cancer Maternal Grandmother   . Migraines Neg Hx    Social History:  History  Alcohol Use No     History  Drug Use  . Yes  . Special: Benzodiazepines, Marijuana    Comment: Stopped smoking marijuana on 08/17/14    Social History   Social History  . Marital Status: Single    Spouse Name: N/A  . Number of Children: 0  . Years of Education: N/A   Social History Main Topics  . Smoking status: Former Smoker -- 0.25 packs/day for 10 years    Types: Cigarettes  . Smokeless tobacco: None  . Alcohol Use: No  . Drug Use: Yes    Special: Benzodiazepines, Marijuana     Comment: Stopped smoking marijuana on 08/17/14  . Sexual Activity: Yes    Birth Control/ Protection: Implant   Other Topics Concern  . None   Social History Narrative   Lives at home with grandparents and children.    Caffeine use: Drinks a few sodas a few times per week    Additional History:    Sleep:  Improved   Appetite:  Fair   Assessment:   Musculoskeletal: Strength & Muscle Tone: within normal limits Gait & Station: normal Patient leans: N/A   Psychiatric Specialty Exam: Physical Exam  ROS (+) headache,  (+) photophobia associated with headaches, headches have Improved  today, no nausea, no vomiting   Blood pressure 114/70, pulse 102, temperature 98.3 F (36.8 C), temperature source Oral, resp. rate 20, height _0  (1.676 m), weight 187 lb (84.823 kg).Body mass index is 30.2 kg/(m^2).  General Appearance: improved   Eye Contact::  Good  Speech:  Normal Rate  Volume:  Normal  Mood:   Less depressed   Affect:   More reactive  Thought Process:  Linear  Orientation:  Full (Time, Place, and Person)  Thought Content:  no hallucinations, no delusions  Suicidal Thoughts:  No- denies any  current thoughts of hurting self or of SI  Homicidal Thoughts:  No  Memory:  recent and remote grossly intact   Judgement:  Fair  Insight:  Present  Psychomotor Activity:  Normal  Concentration:  Good  Recall:  Good  Fund of Knowledge:Good  Language: Good  Akathisia:  Negative  Handed:  Right  AIMS (if indicated):     Assets:  Communication Skills Desire for Improvement Resilience  ADL's:  Fair   Cognition: WNL  Sleep:  Number of Hours: 5.75     Current Medications: Current Facility-Administered Medications  Medication Dose Route Frequency Provider Last Rate Last Dose  . acetaminophen (TYLENOL) tablet 650 mg  650 mg Oral Q6H PRN Evanna Burkett, NP   650 mg at 10/26/14 1327  . alum & mag hydroxide-simeth (MAALOX/MYLANTA) 200-200-20 MG/5ML suspension 30 mL  30 mL Oral Q4H PRN Evanna Burkett, NP   30 mL at 10/24/14 1957  . DULoxetine (CYMBALTA) DR capsule 60 mg  60 mg Oral Daily Jenne Campus, MD   60 mg at 10/27/14 0833  . hydrOXYzine (ATARAX/VISTARIL) tablet 25 mg  25 mg Oral Q6H PRN Gypsy Lore, NP   25 mg at 10/26/14 2220  . ibuprofen (ADVIL,MOTRIN) tablet 400 mg  400 mg Oral Q6H PRN Jenne Campus, MD   400 mg at 10/26/14 1207  . lamoTRIgine (LAMICTAL) tablet 25 mg  25 mg Oral Daily Jenne Campus, MD   25 mg at 10/27/14 0833  . magnesium hydroxide (MILK OF MAGNESIA) suspension 30 mL  30 mL Oral Daily PRN Evanna Burkett, NP      . zolpidem (AMBIEN) tablet 10 mg  10 mg Oral QHS PRN Jenne Campus, MD   10 mg at 10/26/14 2100    Lab Results:  No results found for this or any previous visit (from the past 48 hour(s)).  Physical Findings: AIMS:  , ,  ,  ,    CIWA:    COWS:      Assessment- patient is gradually improving as noted in decreased  Dysphoria and irritability. She also seems more optimistic and future oriented . Thus far tolerating medications well to include Lamictal trial, started to help address headache prophylaxis and  Mood .   Headaches are better  today.  Chronic insomnia  Partially improved on current   Ambien dose- no medication side effects. Tolerating Cymbalta well.    Treatment Plan Summary: Daily contact with patient to assess and evaluate symptoms and progress in treatment, Medication management, Plan inpatient treatment and medications as below Continue  Ambien  10 mgrs  QHS PRN for insomnia Continue  Cymbalta 60 mgrs QDAY for Depression  Continue Lamictal 25 mgrs  QAM- for depression and to help prevent migraines . Ibuprofen PRNs for Headaches.  Continue Hydroxyzine PRNs for Anxiety as needed   Medical Decision Making:  Established Problem, Stable/Improving (1), Review of Psycho-Social Stressors (1), Review  or order clinical lab tests (1), Review of Medication Regimen & Side Effects (2) and Review of New Medication or Change in Dosage (2)     COBOS, FERNANDO 10/27/2014, 9:10 AM

## 2014-10-27 NOTE — [facility] Group Notes (Signed)
[facility] Mental Health Association Group Therapy 10/27/2014 1:15pm  Type of Therapy: Mental Health Association Presentation  Participation Level: Active  Participation Quality: Attentive  Affect: Appropriate  Cognitive: Oriented  Insight: Developing/Improving  Engagement in Therapy: Engaged  Modes of Intervention: Discussion, Education and Socialization  Summary of Progress/Problems: Mental Health Association (MHA) Speaker came to talk about his personal journey with substance abuse and addiction. The pt processed ways by which to relate to the speaker. MHA speaker provided handouts and educational information pertaining to groups and services offered by the [facility]. Pt was engaged in speaker's presentation and was receptive to resources provided.    Chad Cordial, LCSWA 10/27/2014 1:30 PM

## 2014-10-27 NOTE — Progress Notes (Addendum)
  [facility] Adult Case Management Discharge Plan :  Will you be returning to the same living situation after discharge:  Yes,  Pt returning to her grandmother's house At discharge, do you have transportation home?: Yes,  Pt boyfriend to provide transportation Do you have the ability to pay for your medications: Yes,  Pt provided with samples and prescriptions  Release of information consent forms completed and in the chart;  Patient's signature needed at discharge.  Patient to Follow up at: Follow-up Information    Follow up with [facility] On 11/01/2014.   Why:  at 9:30am for therapy with Latoya.   Contact information:   [address] [place], [place] [address] Phone: [phone]      Follow up with [facility] On 11/23/2014.   Why:  at 10:30am for medication management with Manuela Neptune.   Contact information:   [address] [place], [place] [address] Phone: [phone]      Patient denies SI/HI: Yes,  Pt denies    Safety Planning and Suicide Prevention discussed: Yes,  with father; see SPE note for further details  Have you used any form of tobacco in the last 30 days? (Cigarettes, Smokeless Tobacco, Cigars, and/or Pipes): Yes  Has patient been referred to the Quitline?: Patient refused referral  Elaina Hoops 10/27/2014, 10:08 AM

## 2014-10-27 NOTE — Progress Notes (Signed)
Patient attend karoke group tonight.

## 2014-10-27 NOTE — Tx Team (Signed)
Interdisciplinary Treatment Plan Update (Adult) Date: 10/27/2014   Date: 10/27/2014 10:07 AM  Progress in Treatment:  Attending groups: Yes  Participating in groups: Yes  Taking medication as prescribed: Yes  Tolerating medication: Yes  Family/Significant othe contact made: Yes, with father Patient understands diagnosis: Yes Discussing patient identified problems/goals with staff: Yes  Medical problems stabilized or resolved: Yes  Denies suicidal/homicidal ideation: Yes Patient has not harmed self or Others: Yes   New problem(s) identified: None identified at this time.   Discharge Plan or Barriers: Pt will return to her grandparent's home and follow-up with [facility].  Additional comments: n/a   Reason for Continuation of Hospitalization:  Depression Medication stabilization Suicidal ideation  Estimated length of stay: 3-5 days  Review of initial/current patient goals per problem list:   1.  Goal(s): Patient will participate in aftercare plan  Met:  Yes  Target date: 3-5 days from date of admission   As evidenced by: Patient will participate within aftercare plan AEB aftercare provider and housing plan at discharge being identified.   10/25/14: Pt will return home and follow-up with [facility].  2.  Goal (s): Patient will exhibit decreased depressive symptoms and suicidal ideations.  Met:  Yes  Target date: 3-5 days from date of admission   As evidenced by: Patient will utilize self rating of depression at 3 or below and demonstrate decreased signs of depression or be deemed stable for discharge by MD.  10/25/14: Pt rates depression at 4/10, reports improving mood and presents with appropriate affect. Pt engaging in group therapy and interacting well with peers.  10/27/14: Pt rates depression at 2/10 and denies SI.   Attendees:  Patient:    Family:    Physician: Dr. Parke Poisson, MD  10/25/2014 9:02 AM  Nursing: [name], RN Case manager   10/25/2014 9:02 AM  Clinical Social Worker [name], MSW 10/25/2014 9:02 AM  Other: [name], [name] 10/25/2014 9:02 AM  Clinical:  Eben Burow, RN 10/25/2014 9:02 AM  Other: [name], RN Charge Nurse 10/25/2014 9:02 AM  Other:      [name], [name] MSW

## 2014-10-28 MED ORDER — LAMOTRIGINE 25 MG PO TABS: 25.0000 mg | ORAL_TABLET | Freq: Every day | ORAL | Status: DC

## 2014-10-28 MED ORDER — ZOLPIDEM TARTRATE 10 MG PO TABS: 10.0000 mg | ORAL_TABLET | Freq: Every evening | ORAL | Status: AC | PRN

## 2014-10-28 MED ORDER — DULOXETINE HCL 60 MG PO CPEP: 60.0000 mg | ORAL_CAPSULE | Freq: Every day | ORAL | Status: DC

## 2014-10-28 NOTE — [facility] Suicide Risk Assessment (Signed)
[facility] Discharge Suicide Risk Assessment   Demographic Factors:  24 year single female, 2 children- ( 6, 3) , employed   Total Time spent with patient: 30 minutes  Musculoskeletal: Strength & Muscle Tone: within normal limits Gait & Station: normal Patient leans: N/A  Psychiatric Specialty Exam: Physical Exam  ROS describes headaches as much improved at this time   Blood pressure 107/87, pulse 107, temperature 98.2 F (36.8 C), temperature source Oral, resp. rate 18, height 5\' 6"  (1.676 m), weight 187 lb (84.823 kg).Body mass index is 30.2 kg/(m^2).  General Appearance: Well Groomed  Eye Contact::  Good  Speech:  Normal Rate409  Volume:  Normal  Mood:  improved   Affect:  Appropriate  Thought Process:  Goal Directed and Linear  Orientation:  Full (Time, Place, and Person)  Thought Content:  no hallucinations, no delusions, not internally preoccupied   Suicidal Thoughts:  No-at this time not suicidal , denies any thoughts of hurting self   Homicidal Thoughts:  No  Memory:  recent and remote grossly intact   Judgement:  Other:  improved   Insight:  improved   Psychomotor Activity:  Normal  Concentration:  Good  Recall:  Good  Fund of Knowledge:Good  Language: Good  Akathisia:  Negative  Handed:  Right  AIMS (if indicated):     Assets:  Communication Skills Desire for Improvement Social Support Talents/Skills  Sleep:  Number of Hours: 5.75  Cognition: WNL  ADL's:  Improved    Have you used any form of tobacco in the last 30 days? (Cigarettes, Smokeless Tobacco, Cigars, and/or Pipes): Yes  Has this patient used any form of tobacco in the last 30 days? (Cigarettes, Smokeless Tobacco, Cigars, and/or Pipes) Yes, A prescription for an FDA-approved tobacco cessation medication was offered at discharge and the patient refused  Mental Status Per Nursing Assessment::   On Admission:     Current Mental Status by Physician: At this time patient improved, mood is euthymic, affect  is appropriate and at present full in range, no thought disorder , no SI, no HI, no psychotic symptoms, future oriented. At this time denies medication side effects. Tolerating medications well. Currently headaches improved .  Loss Factors: Chronic headaches   Historical Factors: This is first psychiatric admission, no prior history of suicide attempts   Risk Reduction Factors:   Responsible for children under 26 years of age, Sense of responsibility to family, Employed, Living with another person, especially a relative and Positive coping skills or problem solving skills  Continued Clinical Symptoms:  As noted, much improved at present, and currently denying headache .  Cognitive Features That Contribute To Risk:  No gross cognitive deficits noted upon discharge. Is alert , attentive, and oriented x 3   Suicide Risk:  Mild:  Suicidal ideation of limited frequency, intensity, duration, and specificity.  There are no identifiable plans, no associated intent, mild dysphoria and related symptoms, good self-control (both objective and subjective assessment), few other risk factors, and identifiable protective factors, including available and accessible social support.  Principal Problem: MDD (major depressive disorder), recurrent severe, without psychosis Discharge Diagnoses:  Patient Active Problem List   Diagnosis Date Noted  . MDD (major depressive disorder), recurrent severe, without psychosis [F33.2] 10/23/2014  . Headache disorder [R51] 09/14/2014  . Dizzy [R42] 09/14/2014  . Blurry vision, bilateral [H53.8] 09/14/2014  . Pulsatile tinnitus of both ears [H93.13] 09/14/2014  . Perceived hearing changes [H90.5] 09/14/2014  . Vision, loss, sudden [H53.139] 09/14/2014  Follow-up Information    Follow up with [facility] On 11/01/2014.   Why:  at 9:30am for therapy with Latoya.   Contact information:   [address] [place], [place] [address] Phone: [phone]      Follow up with [facility] On 11/23/2014.   Why:  at 10:30am for medication management with Manuela Neptune.   Contact information:   [address] [place], [place] [address] Phone: [phone]      Plan Of Care/Follow-up recommendations:  Activity:  as tolerated  Diet:  Regular Tests:  NA Other:  see below  Is patient on multiple antipsychotic therapies at discharge:  No   Has Patient had three or more failed trials of antipsychotic monotherapy by history:  No  Recommended Plan for Multiple Antipsychotic Therapies: NA  Patient is leaving unit in good spirits. Plans to return home. Lives with grandparents. Follow up as above . Will follow with Dr. Lucia Gaskins- outpatient neurologist , for ongoing management of chronic headaches .  Melina Mosteller 10/28/2014, 1:07 PM

## 2014-10-28 NOTE — Progress Notes (Signed)
Robin Aguirre  Is readied for DC today after MD sees her, complete her DC SRA and DC order.  She denies active SI and she rates her depression, hopelessness and anxiety are " 0/0/07", respectively.  Her DC teaching is completed   And she is given her own cc of her DC AVS. She gives verbal understanding that she will comply and that she understands these instructions. She is escorted to the bldg entance and dc'd per poc.

## 2014-10-28 NOTE — Progress Notes (Signed)
D: Pt has appropriate affect and pleasant mood.  She reports her day has "been great, I feel good, I'm kind of hyper."  Pt reports her goal today was "to make it to my meetings and get rest."  Pt denies SI/HI, denies hallucinations, reports pain from headache of 6/10.  Pt has been visible in milieu interacting with peers and staff appropriately.  Pt attended evening group.   A: Introduced self to pt.  Met with pt 1:1 and provided support and encouragement.  PRN medication administered for sleep and anxiety.  PRN medication for pain was offered and pt refused.   R: Pt verbally contracts for safety and reports that she will notify staff of needs and concerns.  Will continue to monitor and assess.   

## 2014-10-28 NOTE — Plan of Care (Signed)
Problem: Diagnosis: Increased Risk For Suicide Attempt Goal: STG-Patient Will Attend All Groups On The Unit Outcome: Progressing Pt attended evening group on 10/27/14.

## 2014-10-28 NOTE — Progress Notes (Signed)
Recreation Therapy Notes  Date: 08.12.16 Time: 9:30 am Location: [address] Group Room  Group Topic: Stress Management  Goal Area(s) Addresses:  Patient will verbalize importance of using healthy stress management.  Patient will identify positive emotions associated with healthy stress management.   Intervention: Stress Management  Activity :  Guided Training and development officer.  LRT will introduce and educate the patients on the stress management technique of guided imagery.  A script was used to deliver the technique to the patients.  Patients were asked to follow the script read aloud by the LRT to engage in the stress management technique.  Education:  Stress Management, Discharge Planning.   Education Outcome: Acknowledges edcuation/In group clarification offered/Needs additional education  Clinical Observations/Feedback: Patient did not attend group.   Caroll Rancher, LRT/CTRS         Caroll Rancher A 10/28/2014 4:05 PM

## 2014-10-28 NOTE — Discharge Summary (Signed)
Physician Discharge Summary Note  Patient:  Robin Aguirre is an 24 y.o., female MRN:  [id number] DOB:  June 03, 1990 Patient phone:  [phone] (home)  Patient address:   [address] [place] [address],  Total Time spent with patient: 30 minutes  Date of Admission:  10/23/2014 Date of Discharge: 10/28/14  Reason for Admission:  Depression with SI  Principal Problem: MDD (major depressive disorder), recurrent severe, without psychosis Discharge Diagnoses: Patient Active Problem List   Diagnosis Date Noted  . MDD (major depressive disorder), recurrent severe, without psychosis [F33.2] 10/23/2014  . Headache disorder [R51] 09/14/2014  . Dizzy [R42] 09/14/2014  . Blurry vision, bilateral [H53.8] 09/14/2014  . Pulsatile tinnitus of both ears [H93.13] 09/14/2014  . Perceived hearing changes [H90.5] 09/14/2014  . Vision, loss, sudden [H53.139] 09/14/2014    Musculoskeletal: Strength & Muscle Tone: within normal limits Gait & Station: normal Patient leans: N/A  Psychiatric Specialty Exam: See Physician SRA Physical Exam  Psychiatric: She has a normal mood and affect. Her speech is normal and behavior is normal. Judgment and thought content normal. Cognition and memory are normal.    Review of Systems  Constitutional: Negative.   HENT: Negative.   Eyes: Negative.   Respiratory: Negative.   Cardiovascular: Negative.   Gastrointestinal: Negative.   Genitourinary: Negative.   Musculoskeletal: Negative.   Skin: Negative.   Neurological: Negative.   Endo/Heme/Allergies: Negative.   Psychiatric/Behavioral: Positive for depression (Stabilized with treatments ). Negative for suicidal ideas, hallucinations, memory loss and substance abuse. The patient is not nervous/anxious and does not have insomnia.     Blood pressure 107/87, pulse 107, temperature 98.2 F (36.8 C), temperature source Oral, resp. rate 18, height 5\' 6"  (1.676 m), weight 84.823 kg (187 lb).Body mass index is  30.2 kg/(m^2).   Have you used any form of tobacco in the last 30 days? (Cigarettes, Smokeless Tobacco, Cigars, and/or Pipes): Yes  Has this patient used any form of tobacco in the last 30 days? (Cigarettes, Smokeless Tobacco, Cigars, and/or Pipes) Yes, A prescription for an FDA-approved tobacco cessation medication was offered at discharge and the patient refused  Past Medical History:  Past Medical History  Diagnosis Date  . GERD (gastroesophageal reflux disease)   . Colitis, chronic, ulcerative   . Headache     Past Surgical History  Procedure Laterality Date  . Colonoscopy  January 2016  . Removal of iud    . Wisdom tooth extraction  March 2016   Family History:  Family History  Problem Relation Age of Onset  . Cancer Maternal Grandmother   . Migraines Neg Hx    Social History:  History  Alcohol Use No     History  Drug Use  . Yes  . Special: Benzodiazepines, Marijuana    Comment: Stopped smoking marijuana on 08/17/14    Social History   Social History  . Marital Status: Single    Spouse Name: N/A  . Number of Children: 0  . Years of Education: N/A   Social History Main Topics  . Smoking status: Former Smoker -- 0.25 packs/day for 10 years    Types: Cigarettes  . Smokeless tobacco: None  . Alcohol Use: No  . Drug Use: Yes    Special: Benzodiazepines, Marijuana     Comment: Stopped smoking marijuana on 08/17/14  . Sexual Activity: Yes    Birth Control/ Protection: Implant   Other Topics Concern  . None   Social History Narrative   Lives at  home with grandparents and children.    Caffeine use: Drinks a few sodas a few times per week     Risk to Self: Is patient at risk for suicide?: Yes What has been your use of drugs/alcohol within the last 12 months?: uses THC 1-2 times a month Risk to Others:   Prior Inpatient Therapy:   Prior Outpatient Therapy:    Level of Care:  OP  Hospital Course:    Robin Aguirre is an 24 y.o. Female who endorsed SI for  several months. She stated on admission, "I wish death upon myself." She stated, "I could care less if I died." Patient reported she has "a bunch of pills at my house" and she thinks daily about taking an overdose. Her father reported he has 2 prior suicide attempts "due to PTSD." Patient reported she could not contract for safety. She reported she is afraid she might harm herself if she goes back home. Patient endorsed chronic insomnia (8 yrs), fatigue, loss of interest in usual pleasures, isolating bx, guilt and worthlessness. She stated she likes to spend most of her time in her room with the light off. Patient reported chronic HA for the past 2-3 mos and reports she has been to several medical professionals to address HA. She says she worked full time at [employer] until she took short term disability 2 mos ago. She reported she was going to go in to see if she still has her job. She reported her disability ran out but she hadn't been ready to go back to work. Patient reported moderate anxiety. She stated her anxiety is worse in the am when her kids wake up. Patient stated that her anxiety is the worst during the day. She reported she smokes THC three times a month.          Robin Aguirre was admitted to the adult 400 unit where she was evaluated and her symptoms were identified. Medication management was discussed and implemented. The patient was not taking any psychiatric medications prior to admission. The patient was started on Cymbalta 30 mg daily for the treatment of depression and Ambien 10 mg hs prn for insomnia. She was also started on Lamictal 25 mg daily to help with depression and to help prevent migraines. Patient reported that Trazodone had been ineffective for insomniaShe was encouraged to participate in unit programming. Medical problems were identified and treated appropriately. Home medication was restarted as needed.  She was evaluated each day by a clinical provider to ascertain the  patient's response to treatment.  Improvement was noted by the patient's report of decreasing symptoms, improved sleep and appetite, affect, medication tolerance, behavior, and participation in unit programming.  The patient was asked each day to complete a self inventory noting mood, mental status, pain, new symptoms, anxiety and concerns.         She responded well to medication and being in a therapeutic and supportive environment. Her Cymbalta was increased to 60 mg daily for continued treatment of depression. After treatment the patient was noted to gradually improved as evidenced by being less irritable and dysphoric.  Positive and appropriate behavior was noted and the patient was motivated for recovery.  She worked closely with the treatment team and case manager to develop a discharge plan with appropriate goals. Coping skills, problem solving as well as relaxation therapies were also part of the unit programming.         By the day of discharge  she was in much improved condition than upon admission.  Symptoms were reported as significantly decreased or resolved completely. The patient denied SI/HI and voiced no AVH. She was motivated to continue taking medication with a goal of continued improvement in mental health.  Robin Aguirre was discharged home with a plan to follow up as noted below. The patient was provided with three day supply of sample medications and prescriptions at time of discharge. She left [facility] in stable condition with all belongings returned to her. She will be returning to her grandmother's home and boyfriend will provide transportation. Patient also will follow up with Dr. Lucia Gaskins who is her outpatient neurologist, for ongoing management of chronic headaches.   Consults:  None  Significant Diagnostic Studies:  Chemistry profile, CBC, TSH, UDS,   Discharge Vitals:   Blood pressure 107/87, pulse 107, temperature 98.2 F (36.8 C), temperature source Oral, resp. rate 18, height 5'  6" (1.676 m), weight 84.823 kg (187 lb). Body mass index is 30.2 kg/(m^2). Lab Results:   No results found for this or any previous visit (from the past 72 hour(s)).  Physical Findings: AIMS:  , ,  ,  ,    CIWA:    COWS:      See Psychiatric Specialty Exam and Suicide Risk Assessment completed by Attending Physician prior to discharge.  Discharge destination:  Home  Is patient on multiple antipsychotic therapies at discharge:  No   Has Patient had three or more failed trials of antipsychotic monotherapy by history:  No  Recommended Plan for Multiple Antipsychotic Therapies: NA     Medication List    STOP taking these medications        doxylamine (Sleep) 25 MG tablet  Commonly known as:  UNISOM     topiramate 25 MG tablet  Commonly known as:  TOPAMAX      TAKE these medications      Indication   DULoxetine 60 MG capsule  Commonly known as:  CYMBALTA  Take 1 capsule (60 mg total) by mouth daily.   Indication:  Major Depressive Disorder     lamoTRIgine 25 MG tablet  Commonly known as:  LAMICTAL  Take 1 tablet (25 mg total) by mouth daily.   Indication:  Depression     zolpidem 10 MG tablet  Commonly known as:  AMBIEN  Take 1 tablet (10 mg total) by mouth at bedtime as needed for sleep.   Indication:  Trouble Sleeping       Follow-up Information    Follow up with [facility] On 11/01/2014.   Why:  at 9:30am for therapy with Latoya.   Contact information:   [address] [place], [place] [address] Phone: [phone]      Follow up with [facility] On 11/23/2014.   Why:  at 10:30am for medication management with Manuela Neptune.   Contact information:   [address] [place], [place] [address] Phone: [phone]      Follow-up recommendations:    Activity: as tolerated  Diet: Regular Tests: NA Other: see below  Comments:   Take all your medications as prescribed by your mental healthcare provider.  Report any  adverse effects and or reactions from your medicines to your outpatient provider promptly.  Patient is instructed and cautioned to not engage in alcohol and or illegal drug use while on prescription medicines.  In the event of worsening symptoms, patient is instructed to call the crisis hotline, 911 and or go  to the nearest ED for appropriate evaluation and treatment of symptoms.  Follow-up with your primary care provider for your other medical issues, concerns and or health care needs.   Total Discharge Time: Greater than 30 minutes  Signed: DAVIS, LAURA NP-C 10/28/2014, 12:33 PM   Patient seen, Suicide Assessment Completed.  Disposition Plan Reviewed

## 2016-06-22 IMAGING — XA DG FLUORO GUIDE LUMBAR PUNCTURE
1 series · 1 of 1 positions shown · non-contrast
Comparison: none

CLINICAL DATA: Headache. Clinical syndrome of intracranial
hypertension.

[Series 1: ortho standard · 1 of 1 slices shown]
[im 1/1]
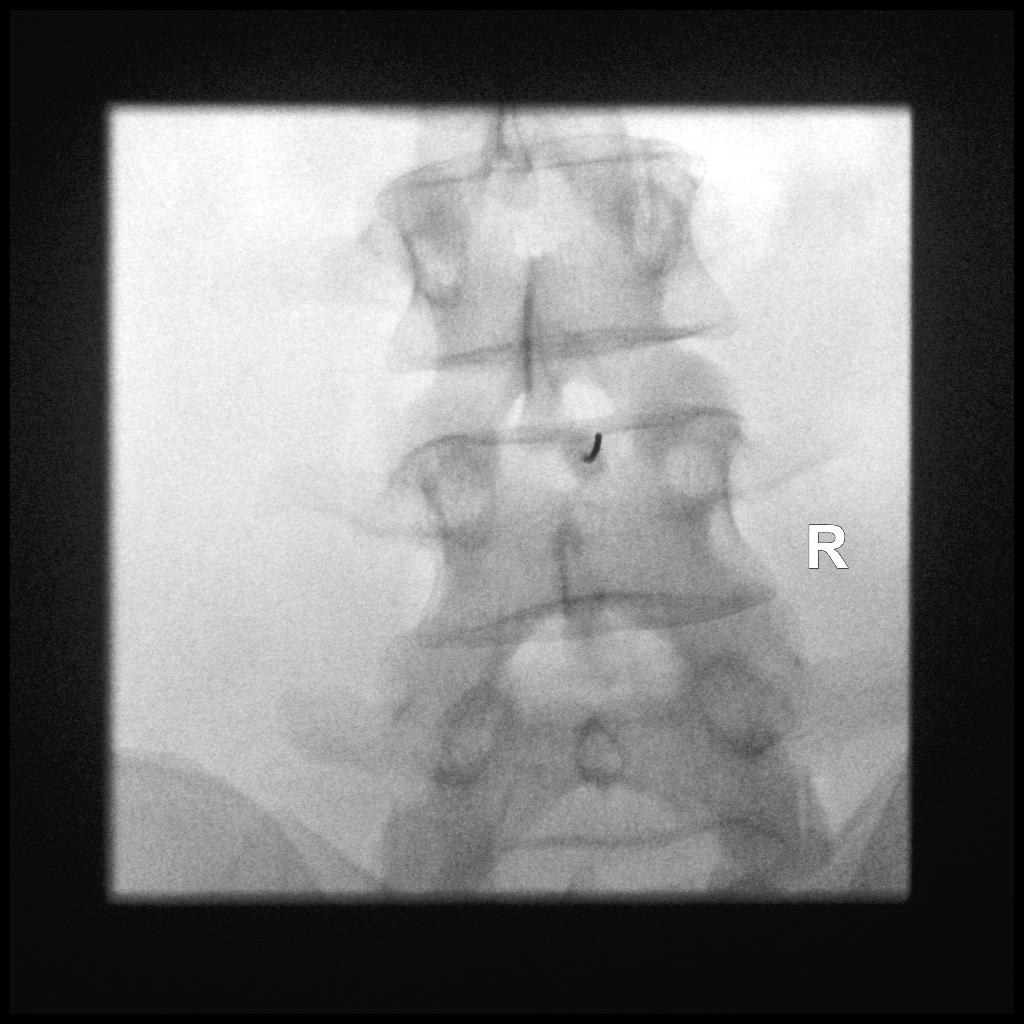

[1 of 1 positions shown; findings below may reference images not displayed]

EXAM:
DIAGNOSTIC LUMBAR PUNCTURE UNDER FLUOROSCOPIC GUIDANCE

FLUOROSCOPY TIME:  Radiation Exposure Index (as provided by the
fluoroscopic device): 0 minutes 14 seconds. 41.7 micro gray meter
squared

PROCEDURE:
Informed consent was obtained from the patient prior to the
procedure, including potential complications of headache, allergy,
and pain. With the patient prone, the lower back was prepped with
Betadine. 1% Lidocaine was used for local anesthesia. Lumbar
puncture was performed at the right L3-4 level using a 20 gauge
needle with return of clear CSF with an opening pressure of 11 cm
water, measured in the lateral decubitus position. 7 ml of CSF were
obtained for laboratory studies. The patient tolerated the procedure
well and there were no apparent complications.
IMPRESSION: Lumbar puncture on the right at L3-4. Normal opening pressure of 11
cm water.

## 2016-12-30 NOTE — Telephone Encounter (Signed)
Close Encounter 

## 2017-08-09 ENCOUNTER — Emergency Department (HOSPITAL_COMMUNITY)
Admission: EM | Admit: 2017-08-09 | Discharge: 2017-08-10 | Disposition: A | Discharge status: Home / Self Care | Payer: BLUE CROSS/BLUE SHIELD | Attending: Emergency Medicine | Admitting: Emergency Medicine

## 2017-08-09 ENCOUNTER — Encounter (HOSPITAL_COMMUNITY): Payer: Self-pay | Admitting: Emergency Medicine

## 2017-08-09 DIAGNOSIS — Y999 Unspecified external cause status: Secondary | ICD-10-CM | POA: Insufficient documentation

## 2017-08-09 DIAGNOSIS — Y929 Unspecified place or not applicable: Secondary | ICD-10-CM | POA: Insufficient documentation

## 2017-08-09 DIAGNOSIS — T7840XA Allergy, unspecified, initial encounter: Secondary | ICD-10-CM | POA: Insufficient documentation

## 2017-08-09 DIAGNOSIS — Z87891 Personal history of nicotine dependence: Secondary | ICD-10-CM | POA: Insufficient documentation

## 2017-08-09 DIAGNOSIS — Y939 Activity, unspecified: Secondary | ICD-10-CM | POA: Insufficient documentation

## 2017-08-09 DIAGNOSIS — Z79899 Other long term (current) drug therapy: Secondary | ICD-10-CM | POA: Insufficient documentation

## 2017-08-09 MED ORDER — FAMOTIDINE 20 MG PO TABS
20.0000 mg | ORAL_TABLET | Freq: Once | ORAL | Status: AC
  Administered 2017-08-09: 20 mg via ORAL
  Filled 2017-08-09: qty 1

## 2017-08-09 MED ORDER — METHYLPREDNISOLONE SODIUM SUCC 125 MG IJ SOLR
125.0000 mg | Freq: Once | INTRAMUSCULAR | Status: AC
  Administered 2017-08-09: 125 mg via INTRAVENOUS
  Filled 2017-08-09: qty 2

## 2017-08-09 MED ORDER — DIPHENHYDRAMINE HCL 50 MG/ML IJ SOLN
50.0000 mg | Freq: Once | INTRAMUSCULAR | Status: AC
  Administered 2017-08-09: 50 mg via INTRAVENOUS
  Filled 2017-08-09: qty 1

## 2017-08-09 NOTE — ED Provider Notes (Signed)
[facility] EMERGENCY DEPARTMENT Provider Note   CSN: [id number] Arrival date & time: 08/09/17  2307     History   Chief Complaint Chief Complaint  Patient presents with  . Allergic Reaction    HPI GAYL IVANOFF is a 27 y.o. female.  HPI Patient is a 27 year old female who presents the emergency department with complaints of feeling like her throat is burning and closing up.  No prior history of allergic reactions.  She was at the movies when this began.  No difficulty breathing but does report painful swallowing.  No rash or hives.  Feels like it is hard to catch her breath.  She did smoke cannabis tonight.  He feels like this is not playing a role.  Symptoms are moderate in severity   Past Medical History:  Diagnosis Date  . Colitis, chronic, ulcerative (HCC)   . GERD (gastroesophageal reflux disease)   . Headache     Patient Active Problem List   Diagnosis Date Noted  . MDD (major depressive disorder), recurrent severe, without psychosis (HCC) 10/23/2014  . Headache disorder 09/14/2014  . Dizzy 09/14/2014  . Blurry vision, bilateral 09/14/2014  . Pulsatile tinnitus of both ears 09/14/2014  . Perceived hearing changes 09/14/2014  . Vision, loss, sudden 09/14/2014    Past Surgical History:  Procedure Laterality Date  . COLONOSCOPY  January 2016  . removal of IUD    . WISDOM TOOTH EXTRACTION  March 2016     OB History    Gravida  2   Para  2   Term  2   Preterm      AB      Living  2     SAB      TAB      Ectopic      Multiple      Live Births  1            Home Medications    Prior to Admission medications   Medication Sig Start Date End Date Taking? Authorizing Provider  DULoxetine (CYMBALTA) 60 MG capsule Take 1 capsule (60 mg total) by mouth daily. 10/28/14   Thermon Leyland, NP  lamoTRIgine (LAMICTAL) 25 MG tablet Take 1 tablet (25 mg total) by mouth daily. 10/28/14   Thermon Leyland, NP  sucralfate (CARAFATE) 1  GM/10ML suspension Take 10 mLs (1 g total) by mouth 4 (four) times daily. 07/09/11 07/09/11  Palumbo, April, MD    Family History Family History  Problem Relation Age of Onset  . Cancer Maternal Grandmother   . Migraines Neg Hx     Social History Social History   Tobacco Use  . Smoking status: Former Smoker    Packs/day: 0.25    Years: 10.00    Pack years: 2.50    Types: Cigarettes  . Smokeless tobacco: Never Used  Substance Use Topics  . Alcohol use: No    Alcohol/week: 0.0 oz  . Drug use: Yes    Types: Benzodiazepines, Marijuana    Comment: Stopped smoking marijuana on 08/17/14     Allergies   Patient has no known allergies.   Review of Systems Review of Systems  All other systems reviewed and are negative.    Physical Exam Updated Vital Signs BP (!) 165/76 (BP Location: Right Arm)   Pulse (!) 123   Temp 98.7 F (37.1 C) (Oral)   Resp 18   Ht  (1.676 m)   Wt 84.8 kg (187  lb)   SpO2 100%   BMI 30.18 kg/m   Physical Exam  Constitutional: She is oriented to person, place, and time. She appears well-developed and well-nourished. No distress.  HENT:  Head: Normocephalic and atraumatic.  Lips normal. Tongue normal. Tolerating secretions. Posterior pharynx with mild erythema. Uvula midline  Eyes: EOM are normal.  Neck: Normal range of motion.  Cardiovascular: Normal rate, regular rhythm and normal heart sounds.  Pulmonary/Chest: Effort normal and breath sounds normal.  Abdominal: Soft. She exhibits no distension. There is no tenderness.  Musculoskeletal: Normal range of motion.  Neurological: She is alert and oriented to person, place, and time.  Skin: Skin is warm and dry.  Psychiatric: She has a normal mood and affect. Judgment normal.  Nursing note and vitals reviewed.    ED Treatments / Results  Labs (all labs ordered are listed, but only abnormal results are displayed) Labs Reviewed - No data to display  EKG ECG interpretation   Date:  08/10/2017  Rate: 122  Rhythm: normal sinus rhythm  QRS Axis: normal  Intervals: normal  ST/T Wave abnormalities: normal  Conduction Disutrbances: none  Narrative Interpretation:   Old EKG Reviewed: no prior ecg available     Radiology No results found.  Procedures Procedures (including critical care time)  Medications Ordered in ED Medications  diphenhydrAMINE (BENADRYL) injection 50 mg (has no administration in time range)  methylPREDNISolone sodium succinate (SOLU-MEDROL) 125 mg/2 mL injection 125 mg (has no administration in time range)  famotidine (PEPCID) tablet 20 mg (has no administration in time range)     Initial Impression / Assessment and Plan / ED Course  I have reviewed the triage vital signs and the nursing notes.  Pertinent labs & imaging results that were available during my care of the patient were reviewed by me and considered in my medical decision making (see chart for details).     Observed in the ER. Improved. No need for epi. Home with standard medications on standard allergic reaction precautions.  Heart rate improved.  Patient understands return to the ER for new or worsening symptoms  Final Clinical Impressions(s) / ED Diagnoses   Final diagnoses:  Allergic reaction, initial encounter    ED Discharge Orders    None       Azalia Bilis, MD 08/10/17 [phone]

## 2017-08-09 NOTE — ED Triage Notes (Signed)
Reports for the last hour or so feeling like throat is burning on left side and that it is closing up.  No hx of allergic reaction.  No known allergies.

## 2017-08-10 LAB — GROUP A STREP BY PCR: Group A Strep by PCR: NOT DETECTED

## 2017-08-10 MED ORDER — DIPHENHYDRAMINE HCL 25 MG PO TABS: 25.0000 mg | ORAL_TABLET | Freq: Four times a day (QID) | ORAL | 0 refills | Status: DC

## 2017-08-10 MED ORDER — FAMOTIDINE 20 MG PO TABS: 20.0000 mg | ORAL_TABLET | Freq: Two times a day (BID) | ORAL | 0 refills | Status: DC

## 2017-08-10 MED ORDER — PREDNISONE 20 MG PO TABS: 40.0000 mg | ORAL_TABLET | Freq: Every day | ORAL | 0 refills | Status: AC

## 2017-08-10 NOTE — ED Notes (Signed)
Reports feeling better except for a sore throat.  Specimen sent for strep at this time.  To continue to monitor.

## 2018-03-08 ENCOUNTER — Other Ambulatory Visit: Payer: Self-pay

## 2018-03-08 ENCOUNTER — Encounter (HOSPITAL_COMMUNITY): Payer: Self-pay

## 2018-03-08 ENCOUNTER — Emergency Department (HOSPITAL_COMMUNITY): Payer: Self-pay

## 2018-03-08 ENCOUNTER — Emergency Department (HOSPITAL_COMMUNITY)
Admission: EM | Admit: 2018-03-08 | Discharge: 2018-03-08 | Disposition: A | Discharge status: Home / Self Care | Payer: Self-pay | Attending: Emergency Medicine | Admitting: Emergency Medicine

## 2018-03-08 DIAGNOSIS — Z79899 Other long term (current) drug therapy: Secondary | ICD-10-CM | POA: Insufficient documentation

## 2018-03-08 DIAGNOSIS — Z87891 Personal history of nicotine dependence: Secondary | ICD-10-CM | POA: Insufficient documentation

## 2018-03-08 DIAGNOSIS — J101 Influenza due to other identified influenza virus with other respiratory manifestations: Secondary | ICD-10-CM

## 2018-03-08 LAB — COMPREHENSIVE METABOLIC PANEL
ALT: 13 U/L (ref 0–44)
AST: 18 U/L (ref 15–41)
Albumin: 4.5 g/dL (ref 3.5–5.0)
Alkaline Phosphatase: 51 U/L (ref 38–126)
Anion gap: 12 (ref 5–15)
BUN: 13 mg/dL (ref 6–20)
CO2: 21 mmol/L — ABNORMAL LOW (ref 22–32)
Calcium: 8.9 mg/dL (ref 8.9–10.3)
Chloride: 102 mmol/L (ref 98–111)
Creatinine, Ser: 0.82 mg/dL (ref 0.44–1.00)
GFR calc Af Amer: >60 mL/min (ref >60)
GFR calc non Af Amer: >60 mL/min (ref >60)
Glucose, Bld: 107 mg/dL — ABNORMAL HIGH (ref 70–99)
Potassium: 3.2 mmol/L — ABNORMAL LOW (ref 3.5–5.1)
Sodium: 135 mmol/L (ref 135–145)
Total Bilirubin: 0.9 mg/dL (ref 0.3–1.2)
Total Protein: 8.1 g/dL (ref 6.5–8.1)

## 2018-03-08 LAB — CBC WITH DIFFERENTIAL/PLATELET
Abs Immature Granulocytes: 0.04 10*3/uL (ref 0.00–0.07)
Basophils Absolute: 0 10*3/uL (ref 0.0–0.1)
Basophils Relative: 1 %
Eosinophils Absolute: 0 10*3/uL (ref 0.0–0.5)
Eosinophils Relative: 0 %
HCT: 40.4 % (ref 36.0–46.0)
Hemoglobin: 13.9 g/dL (ref 12.0–15.0)
Immature Granulocytes: 1 %
Lymphocytes Relative: 13 %
Lymphs Abs: 1.1 10*3/uL (ref 0.7–4.0)
MCH: 31.3 pg (ref 26.0–34.0)
MCHC: 34.4 g/dL (ref 30.0–36.0)
MCV: 91 fL (ref 80.0–100.0)
Monocytes Absolute: 1 10*3/uL (ref 0.1–1.0)
Monocytes Relative: 12 %
Neutro Abs: 6.1 10*3/uL (ref 1.7–7.7)
Neutrophils Relative %: 73 %
Platelets: 289 10*3/uL (ref 150–400)
RBC: 4.44 MIL/uL (ref 3.87–5.11)
RDW: 11.8 % (ref 11.5–15.5)
WBC: 8.2 10*3/uL (ref 4.0–10.5)
nRBC: 0 % (ref 0.0–0.2)

## 2018-03-08 LAB — INFLUENZA PANEL BY PCR (TYPE A & B)
Influenza A By PCR: NEGATIVE
Influenza B By PCR: POSITIVE — AB

## 2018-03-08 LAB — GROUP A STREP BY PCR: Group A Strep by PCR: NOT DETECTED

## 2018-03-08 LAB — I-STAT BETA HCG BLOOD, ED (MC, WL, AP ONLY): I-stat hCG, quantitative: <5 m[IU]/mL (ref <5)

## 2018-03-08 LAB — LIPASE, BLOOD: Lipase: 25 U/L (ref 11–51)

## 2018-03-08 MED ORDER — SODIUM CHLORIDE 0.9 % IV BOLUS
1000.0000 mL | Freq: Once | INTRAVENOUS | Status: AC
  Administered 2018-03-08: 1000 mL via INTRAVENOUS

## 2018-03-08 MED ORDER — KETOROLAC TROMETHAMINE 30 MG/ML IJ SOLN
30.0000 mg | Freq: Once | INTRAMUSCULAR | Status: AC
  Administered 2018-03-08: 30 mg via INTRAVENOUS
  Filled 2018-03-08: qty 1

## 2018-03-08 MED ORDER — METOCLOPRAMIDE HCL 5 MG/ML IJ SOLN
10.0000 mg | Freq: Once | INTRAMUSCULAR | Status: AC
  Administered 2018-03-08: 10 mg via INTRAVENOUS
  Filled 2018-03-08: qty 2

## 2018-03-08 MED ORDER — ACETAMINOPHEN 500 MG PO TABS: 500.0000 mg | ORAL_TABLET | Freq: Four times a day (QID) | ORAL | 0 refills | Status: DC | PRN

## 2018-03-08 MED ORDER — POTASSIUM CHLORIDE CRYS ER 20 MEQ PO TBCR
60.0000 meq | EXTENDED_RELEASE_TABLET | Freq: Once | ORAL | Status: AC
  Administered 2018-03-08: 60 meq via ORAL
  Filled 2018-03-08: qty 3

## 2018-03-08 MED ORDER — ONDANSETRON HCL 4 MG PO TABS: 4.0000 mg | ORAL_TABLET | Freq: Four times a day (QID) | ORAL | 0 refills | Status: DC | PRN

## 2018-03-08 MED ORDER — IBUPROFEN 600 MG PO TABS: 600.0000 mg | ORAL_TABLET | Freq: Four times a day (QID) | ORAL | 0 refills | Status: DC | PRN

## 2018-03-08 MED ORDER — OSELTAMIVIR PHOSPHATE 75 MG PO CAPS: 75.0000 mg | ORAL_CAPSULE | Freq: Two times a day (BID) | ORAL | 0 refills | Status: DC

## 2018-03-08 MED ORDER — BENZONATATE 100 MG PO CAPS: 100.0000 mg | ORAL_CAPSULE | Freq: Three times a day (TID) | ORAL | 0 refills | Status: DC | PRN

## 2018-03-08 MED ORDER — ACETAMINOPHEN 500 MG PO TABS
1000.0000 mg | ORAL_TABLET | Freq: Once | ORAL | Status: AC
Start: 2018-03-08 — End: 2018-03-08
  Administered 2018-03-08: 1000 mg via ORAL
  Filled 2018-03-08: qty 2

## 2018-03-08 NOTE — ED Provider Notes (Signed)
[facility]-EMERGENCY DEPT Provider Note   CSN: [id number] Arrival date & time: 03/08/18  1021     History   Chief Complaint Chief Complaint  Patient presents with  . Generalized weakness  . Sore Throat    HPI Robin Aguirre is a 27 y.o. female with history of GERD, chronic ulcerative colitis who presents with a 2-day history of cough, sore throat, generalized body aches, headache, nausea.  Patient is unaware of any fever at home, but has had chills.  Her cough is productive.  She took TheraFlu at home without relief.  She is spent the last couple days in bed.  She reports some generalized lower abdominal aching.  She denies any urinary symptoms, abnormal vaginal bleeding or discharge.  HPI  Past Medical History:  Diagnosis Date  . Colitis, chronic, ulcerative (HCC)   . GERD (gastroesophageal reflux disease)   . Headache     Patient Active Problem List   Diagnosis Date Noted  . MDD (major depressive disorder), recurrent severe, without psychosis (HCC) 10/23/2014  . Headache disorder 09/14/2014  . Dizzy 09/14/2014  . Blurry vision, bilateral 09/14/2014  . Pulsatile tinnitus of both ears 09/14/2014  . Perceived hearing changes 09/14/2014  . Vision, loss, sudden 09/14/2014    Past Surgical History:  Procedure Laterality Date  . COLONOSCOPY  January 2016  . removal of IUD    . WISDOM TOOTH EXTRACTION  March 2016     OB History    Gravida  2   Para  2   Term  2   Preterm      AB      Living  2     SAB      TAB      Ectopic      Multiple      Live Births  1            Home Medications    Prior to Admission medications   Medication Sig Start Date End Date Taking? Authorizing Provider  acetaminophen (TYLENOL) 500 MG tablet Take 1 tablet (500 mg total) by mouth every 6 (six) hours as needed. 03/08/18   Juddson Cobern, Waylan BogaAlexandra M, PA-C  benzonatate (TESSALON) 100 MG capsule Take 1 capsule (100 mg total) by mouth 3 (three) times daily  as needed for cough. 03/08/18   Keaghan Staton, Waylan BogaAlexandra M, PA-C  ibuprofen (ADVIL,MOTRIN) 600 MG tablet Take 1 tablet (600 mg total) by mouth every 6 (six) hours as needed. 03/08/18   Breona Cherubin, Waylan BogaAlexandra M, PA-C  ondansetron (ZOFRAN) 4 MG tablet Take 1 tablet (4 mg total) by mouth every 6 (six) hours as needed for nausea or vomiting. 03/08/18   Callee Rohrig, Waylan BogaAlexandra M, PA-C  oseltamivir (TAMIFLU) 75 MG capsule Take 1 capsule (75 mg total) by mouth every 12 (twelve) hours. 03/08/18   Emi HolesLaw, Emil Weigold M, PA-C    Family History Family History  Problem Relation Age of Onset  . Cancer Maternal Grandmother   . Migraines Neg Hx     Social History Social History   Tobacco Use  . Smoking status: Former Smoker    Packs/day: 0.25    Years: 10.00    Pack years: 2.50    Types: Cigarettes  . Smokeless tobacco: Never Used  Substance Use Topics  . Alcohol use: No    Alcohol/week: 0.0 standard drinks  . Drug use: Yes    Types: Benzodiazepines, Marijuana    Comment: Stopped smoking marijuana on 08/17/14     Allergies  Patient has no known allergies.   Review of Systems Review of Systems  Constitutional: Positive for chills. Negative for fever.  HENT: Positive for congestion and sore throat. Negative for facial swelling.   Respiratory: Positive for cough. Negative for shortness of breath.   Cardiovascular: Negative for chest pain.  Gastrointestinal: Positive for abdominal pain and nausea. Negative for diarrhea and vomiting.  Genitourinary: Negative for dysuria.  Musculoskeletal: Positive for myalgias. Negative for back pain.  Skin: Negative for rash and wound.  Neurological: Positive for headaches.  Psychiatric/Behavioral: The patient is not nervous/anxious.      Physical Exam Updated Vital Signs BP 100/87 (BP Location: Left Arm)   Pulse 77   Temp 99.9 F (37.7 C) (Oral)   Resp 14   Ht 5\' 5"  (1.651 m)   Wt 78.9 kg   LMP 02/15/2018 (Within Days)   SpO2 98%   BMI 28.96 kg/m   Physical  Exam Vitals signs and nursing note reviewed.  Constitutional:      General: She is not in acute distress.    Appearance: She is well-developed. She is not diaphoretic.  HENT:     Head: Normocephalic and atraumatic.     Right Ear: Tympanic membrane normal.     Left Ear: Tympanic membrane normal.     Mouth/Throat:     Mouth: Mucous membranes are dry.     Pharynx: Pharyngeal swelling and posterior oropharyngeal erythema present. No oropharyngeal exudate.     Tonsils: Tonsillar exudate present. No tonsillar abscesses. Swelling: 2+ on the right. 2+ on the left.  Eyes:     General: No scleral icterus.       Right eye: No discharge.        Left eye: No discharge.     Conjunctiva/sclera: Conjunctivae normal.     Pupils: Pupils are equal, round, and reactive to light.  Neck:     Musculoskeletal: Normal range of motion and neck supple.     Thyroid: No thyromegaly.  Cardiovascular:     Rate and Rhythm: Regular rhythm. Tachycardia present.     Heart sounds: Normal heart sounds. No murmur. No friction rub. No gallop.   Pulmonary:     Effort: Pulmonary effort is normal. No respiratory distress.     Breath sounds: Normal breath sounds. No stridor. No wheezing or rales.  Abdominal:     General: Bowel sounds are normal. There is no distension.     Palpations: Abdomen is soft.     Tenderness: There is no abdominal tenderness. There is no guarding or rebound.  Lymphadenopathy:     Cervical: No cervical adenopathy.  Skin:    General: Skin is warm and dry.     Coloration: Skin is not pale.     Findings: No rash.  Neurological:     Mental Status: She is alert.     Coordination: Coordination normal.      ED Treatments / Results  Labs (all labs ordered are listed, but only abnormal results are displayed) Labs Reviewed  COMPREHENSIVE METABOLIC PANEL - Abnormal; Notable for the following components:      Result Value   Potassium 3.2 (*)    CO2 21 (*)    Glucose, Bld 107 (*)    All other  components within normal limits  INFLUENZA PANEL BY PCR (TYPE A & B) - Abnormal; Notable for the following components:   Influenza B By PCR POSITIVE (*)    All other components within normal limits  GROUP A  STREP BY PCR  CBC WITH DIFFERENTIAL/PLATELET  LIPASE, BLOOD  I-STAT BETA HCG BLOOD, ED ([facility], [facility], [facility] ONLY)    EKG None  Radiology Dg Chest 2 View  Result Date: 03/08/2018 CLINICAL DATA:  Nausea, weakness, cough, sore throat and headache 3 days. EXAM: CHEST - 2 VIEW COMPARISON:  02/21/2012 FINDINGS: The heart size and mediastinal contours are within normal limits. Both lungs are clear. The visualized skeletal structures are unremarkable. IMPRESSION: No active cardiopulmonary disease. Electronically Signed   By: Elberta Fortis M.D.   On: 03/08/2018 11:20    Procedures Procedures (including critical care time)  Medications Ordered in ED Medications  sodium chloride 0.9 % bolus 1,000 mL (0 mLs Intravenous Stopped 03/08/18 1248)  metoCLOPramide (REGLAN) injection 10 mg (10 mg Intravenous Given 03/08/18 1128)  acetaminophen (TYLENOL) tablet 1,000 mg (1,000 mg Oral Given 03/08/18 1129)  ketorolac (TORADOL) 30 MG/ML injection 30 mg (30 mg Intravenous Given 03/08/18 1211)  potassium chloride SA (K-DUR,KLOR-CON) CR tablet 60 mEq (60 mEq Oral Given 03/08/18 1302)  sodium chloride 0.9 % bolus 1,000 mL (0 mLs Intravenous Stopped 03/08/18 1420)     Initial Impression / Assessment and Plan / ED Course  I have reviewed the triage vital signs and the nursing notes.  Pertinent labs & imaging results that were available during my care of the patient were reviewed by me and considered in my medical decision making (see chart for details).     Patient with influenza B.  CMP shows potassium 3.2, which was replaced in the ED.  Otherwise labs are unremarkable.  Chest x-ray is clear.  Patient given 2 L of fluid and tachycardia is resolved.  Patient would like Tamiflu.  Patient also given symptomatic  treatment including Tessalon, Zofran, ibuprofen, Tylenol.  Advised fluids, rest.  Return precautions discussed.  Patient understands and agrees with plan.  Patient vitals stable throughout ED course and discharged in satisfactory condition.  Final Clinical Impressions(s) / ED Diagnoses   Final diagnoses:  Influenza B    ED Discharge Orders         Ordered    oseltamivir (TAMIFLU) 75 MG capsule  Every 12 hours     03/08/18 1413    benzonatate (TESSALON) 100 MG capsule  3 times daily PRN     03/08/18 1413    ondansetron (ZOFRAN) 4 MG tablet  Every 6 hours PRN     03/08/18 1413    ibuprofen (ADVIL,MOTRIN) 600 MG tablet  Every 6 hours PRN     03/08/18 1413    acetaminophen (TYLENOL) 500 MG tablet  Every 6 hours PRN     03/08/18 1413           Coco Sharpnack, [place], PA-C 03/08/18 1444    Pricilla Loveless, MD 03/08/18 [phone]

## 2018-03-08 NOTE — ED Notes (Signed)
Patient transported to X-ray 

## 2018-03-08 NOTE — ED Notes (Signed)
Walked pt to restroom; gave pt a specimen cup and told her we needed a urine sample. Pt did not provide urine sample and stated her urine "just came out" before she could place the cup. RN made aware.

## 2018-03-08 NOTE — Discharge Instructions (Addendum)
Take Tamiflu twice daily to help shorten the course of the flu.  Take Tessalon every 8 hours as needed for cough.  Take Zofran every 6 hours as needed for nausea or vomiting.  Alternate ibuprofen and Tylenol as prescribed for fever, headache, body aches.  You can alternate types every 3 hours, but do not take the same medication more than every 6 hours as prescribed.  Make sure to drink plenty of fluids and get plenty of rest.  Please return to the emergency department if you develop any new or worsening symptoms.

## 2018-03-08 NOTE — ED Triage Notes (Signed)
[facility]. Patient is AOx4 and ambulatory. Patient stated symptoms started Friday and have gradually worsened. Symptoms include, nausea, weakness, coughing, sore throat, headache and generalized body aches.

## 2019-04-07 ENCOUNTER — Ambulatory Visit: Payer: Self-pay | Admitting: Internal Medicine

## 2019-04-16 DIAGNOSIS — Z6833 Body mass index (BMI) 33.0-33.9, adult: Secondary | ICD-10-CM | POA: Diagnosis not present

## 2019-04-16 DIAGNOSIS — Z3046 Encounter for surveillance of implantable subdermal contraceptive: Secondary | ICD-10-CM | POA: Diagnosis not present

## 2019-04-16 DIAGNOSIS — Z01419 Encounter for gynecological examination (general) (routine) without abnormal findings: Secondary | ICD-10-CM | POA: Diagnosis not present

## 2019-04-16 DIAGNOSIS — Z113 Encounter for screening for infections with a predominantly sexual mode of transmission: Secondary | ICD-10-CM | POA: Diagnosis not present

## 2019-06-11 DIAGNOSIS — Z6831 Body mass index (BMI) 31.0-31.9, adult: Secondary | ICD-10-CM | POA: Diagnosis not present

## 2019-06-11 DIAGNOSIS — Z113 Encounter for screening for infections with a predominantly sexual mode of transmission: Secondary | ICD-10-CM | POA: Diagnosis not present

## 2019-06-17 ENCOUNTER — Ambulatory Visit: Payer: Self-pay | Admitting: Internal Medicine

## 2019-06-17 DIAGNOSIS — Z0289 Encounter for other administrative examinations: Secondary | ICD-10-CM

## 2019-07-23 DIAGNOSIS — Z113 Encounter for screening for infections with a predominantly sexual mode of transmission: Secondary | ICD-10-CM | POA: Diagnosis not present

## 2019-07-23 DIAGNOSIS — Z6830 Body mass index (BMI) 30.0-30.9, adult: Secondary | ICD-10-CM | POA: Diagnosis not present

## 2019-07-23 DIAGNOSIS — B373 Candidiasis of vulva and vagina: Secondary | ICD-10-CM | POA: Diagnosis not present

## 2019-08-17 DIAGNOSIS — N9089 Other specified noninflammatory disorders of vulva and perineum: Secondary | ICD-10-CM | POA: Diagnosis not present

## 2019-08-17 DIAGNOSIS — Z6829 Body mass index (BMI) 29.0-29.9, adult: Secondary | ICD-10-CM | POA: Diagnosis not present

## 2019-08-17 DIAGNOSIS — Z113 Encounter for screening for infections with a predominantly sexual mode of transmission: Secondary | ICD-10-CM | POA: Diagnosis not present

## 2019-09-28 DIAGNOSIS — K146 Glossodynia: Secondary | ICD-10-CM | POA: Diagnosis not present

## 2019-09-28 DIAGNOSIS — M549 Dorsalgia, unspecified: Secondary | ICD-10-CM | POA: Diagnosis not present

## 2019-09-28 DIAGNOSIS — Z720 Tobacco use: Secondary | ICD-10-CM | POA: Diagnosis not present

## 2019-09-28 DIAGNOSIS — R591 Generalized enlarged lymph nodes: Secondary | ICD-10-CM | POA: Diagnosis not present

## 2019-09-28 DIAGNOSIS — Z13220 Encounter for screening for lipoid disorders: Secondary | ICD-10-CM | POA: Diagnosis not present

## 2019-10-07 DIAGNOSIS — R21 Rash and other nonspecific skin eruption: Secondary | ICD-10-CM | POA: Diagnosis not present

## 2019-10-07 DIAGNOSIS — R1311 Dysphagia, oral phase: Secondary | ICD-10-CM | POA: Diagnosis not present

## 2019-10-07 DIAGNOSIS — K146 Glossodynia: Secondary | ICD-10-CM | POA: Diagnosis not present

## 2019-10-07 DIAGNOSIS — R590 Localized enlarged lymph nodes: Secondary | ICD-10-CM | POA: Diagnosis not present

## 2019-10-15 ENCOUNTER — Other Ambulatory Visit: Payer: Self-pay

## 2019-10-15 ENCOUNTER — Encounter (HOSPITAL_COMMUNITY): Payer: Self-pay

## 2019-10-15 ENCOUNTER — Emergency Department (HOSPITAL_COMMUNITY)
Admission: EM | Admit: 2019-10-15 | Discharge: 2019-10-15 | Disposition: A | Discharge status: Home / Self Care | Payer: BC Managed Care – PPO | Attending: Emergency Medicine | Admitting: Emergency Medicine

## 2019-10-15 DIAGNOSIS — R21 Rash and other nonspecific skin eruption: Secondary | ICD-10-CM

## 2019-10-15 DIAGNOSIS — J029 Acute pharyngitis, unspecified: Secondary | ICD-10-CM

## 2019-10-15 DIAGNOSIS — Z87891 Personal history of nicotine dependence: Secondary | ICD-10-CM | POA: Insufficient documentation

## 2019-10-15 LAB — GROUP A STREP BY PCR: Group A Strep by PCR: NOT DETECTED

## 2019-10-15 MED ORDER — LIDOCAINE VISCOUS HCL 2 % MT SOLN
15.0000 mL | Freq: Once | OROMUCOSAL | Status: AC
  Administered 2019-10-15: 15 mL via OROMUCOSAL
  Filled 2019-10-15: qty 15

## 2019-10-15 MED ORDER — DEXAMETHASONE 4 MG PO TABS
10.0000 mg | ORAL_TABLET | Freq: Once | ORAL | Status: AC
  Administered 2019-10-15: 10 mg via ORAL
  Filled 2019-10-15: qty 2

## 2019-10-15 NOTE — Discharge Instructions (Signed)
Follow up with your ENT in the office.  Return for worsening trouble swallowing, drooling, fever.   Take 4 over the counter ibuprofen tablets 3 times a day or 2 over-the-counter naproxen tablets twice a day for pain. Also take tylenol 1000mg (2 extra strength) four times a day.

## 2019-10-15 NOTE — ED Provider Notes (Signed)
[facility]   CSN: [id number] Arrival date & time: 10/15/19  1434     History Chief Complaint  Patient presents with  . Sore Throat    Robin Aguirre is a 29 y.o. female.  29 yo F with a chief complaints of sore throat.  This is been ongoing for the past few days.  She has been having a syndrome where she broke out with a rash and had swollen glands and bit her tongue.  She had been seen by ENT earlier in the week and told she had swollen tonsils but no obvious acute infection.  She was started on amoxicillin.  She feels that the throat has gotten much more sore over the past couple days to the point where she has razor blades when she tries to swallow.  No fevers no cough or congestion no abdominal pain no nausea vomiting.  She has had a rash that is not pruritic that is mostly on the trunk but is extended to the upper and lower extremities over the past week and a half.  Occurred prior to the illness.  No known sick contacts.  No recent travel.  The history is provided by the patient.  Sore Throat This is a new problem. The current episode started more than 2 days ago. The problem occurs constantly. The problem has not changed since onset.Pertinent negatives include no chest pain, no abdominal pain, no headaches and no shortness of breath. Nothing aggravates the symptoms. Nothing relieves the symptoms. She has tried nothing for the symptoms. The treatment provided no relief.       Past Medical History:  Diagnosis Date  . Colitis, chronic, ulcerative (HCC)   . GERD (gastroesophageal reflux disease)   . Headache     Patient Active Problem List   Diagnosis Date Noted  . MDD (major depressive disorder), recurrent severe, without psychosis (HCC) 10/23/2014  . Headache disorder 09/14/2014  . Dizzy 09/14/2014  . Blurry vision, bilateral 09/14/2014  . Pulsatile tinnitus of both ears 09/14/2014  . Perceived hearing changes 09/14/2014    . Vision, loss, sudden 09/14/2014    Past Surgical History:  Procedure Laterality Date  . COLONOSCOPY  January 2016  . removal of IUD    . WISDOM TOOTH EXTRACTION  March 2016     OB History    Gravida  2   Para  2   Term  2   Preterm      AB      Living  2     SAB      TAB      Ectopic      Multiple      Live Births  1           Family History  Problem Relation Age of Onset  . Healthy Mother   . Healthy Father   . Cancer Maternal Grandmother   . Migraines Neg Hx     Social History   Tobacco Use  . Smoking status: Former Smoker    Packs/day: 0.25    Years: 10.00    Pack years: 2.50    Types: Cigarettes  . Smokeless tobacco: Never Used  Vaping Use  . Vaping Use: Never used  Substance Use Topics  . Alcohol use: No    Alcohol/week: 0.0 standard drinks  . Drug use: Yes    Types: Benzodiazepines, Marijuana    Comment: Stopped smoking marijuana on 08/17/14    Home Medications  Prior to Admission medications   Medication Sig Start Date End Date Taking? Authorizing Provider  acetaminophen (TYLENOL) 500 MG tablet Take 1 tablet (500 mg total) by mouth every 6 (six) hours as needed. 03/08/18   Law, Waylan Boga, PA-C  benzonatate (TESSALON) 100 MG capsule Take 1 capsule (100 mg total) by mouth 3 (three) times daily as needed for cough. 03/08/18   Law, Waylan Boga, PA-C  ibuprofen (ADVIL,MOTRIN) 600 MG tablet Take 1 tablet (600 mg total) by mouth every 6 (six) hours as needed. 03/08/18   Law, Waylan Boga, PA-C  ondansetron (ZOFRAN) 4 MG tablet Take 1 tablet (4 mg total) by mouth every 6 (six) hours as needed for nausea or vomiting. 03/08/18   Law, Waylan Boga, PA-C  oseltamivir (TAMIFLU) 75 MG capsule Take 1 capsule (75 mg total) by mouth every 12 (twelve) hours. 03/08/18   Emi Holes, PA-C    Allergies    Patient has no known allergies.  Review of Systems   Review of Systems  Constitutional: Negative for chills and fever.  HENT: Positive for  sore throat. Negative for congestion and rhinorrhea.   Eyes: Negative for redness and visual disturbance.  Respiratory: Negative for shortness of breath and wheezing.   Cardiovascular: Negative for chest pain and palpitations.  Gastrointestinal: Negative for abdominal pain, nausea and vomiting.  Genitourinary: Negative for dysuria and urgency.  Musculoskeletal: Negative for arthralgias and myalgias.  Skin: Positive for rash. Negative for pallor and wound.  Neurological: Negative for dizziness and headaches.    Physical Exam Updated Vital Signs BP (!) 115/86 (BP Location: Left Arm)   Pulse 96   Temp 98.3 F (36.8 C) (Oral)   Resp 16   Ht 5\' 5"  (1.651 m)   Wt 77.6 kg   LMP 09/15/2019   SpO2 98%   BMI 28.46 kg/m   Physical Exam Vitals and nursing note reviewed.  Constitutional:      General: She is not in acute distress.    Appearance: She is well-developed. She is not diaphoretic.  HENT:     Head: Normocephalic and atraumatic.     Comments: No obvious lymphadenopathy.  Mild tonsillar swelling but no exudates.  Old appearing injury to the tip of the tongue.  Tolerating secretions without difficulty.  Able to rotate her head without pain. Eyes:     Pupils: Pupils are equal, round, and reactive to light.  Cardiovascular:     Rate and Rhythm: Normal rate and regular rhythm.     Heart sounds: No murmur heard.  No friction rub. No gallop.   Pulmonary:     Effort: Pulmonary effort is normal.     Breath sounds: No wheezing or rales.  Abdominal:     General: There is no distension.     Palpations: Abdomen is soft.     Tenderness: There is no abdominal tenderness.  Musculoskeletal:        General: No tenderness.     Cervical back: Normal range of motion and neck supple.  Skin:    General: Skin is warm and dry.     Comments: Diffuse papular rash mostly on the trunk.  Sparsely on the extremities.  Neurological:     Mental Status: She is alert and oriented to person, place, and  time.  Psychiatric:        Behavior: Behavior normal.     ED Results / Procedures / Treatments   Labs (all labs ordered are listed, but only abnormal results are  displayed) Labs Reviewed  GROUP A STREP BY PCR    EKG None  Radiology No results found.  Procedures Procedures (including critical care time)  Medications Ordered in ED Medications  lidocaine (XYLOCAINE) 2 % viscous mouth solution 15 mL (has no administration in time range)  dexamethasone (DECADRON) tablet 10 mg (has no administration in time range)    ED Course  I have reviewed the triage vital signs and the nursing notes.  Pertinent labs & imaging results that were available during my care of the patient were reviewed by me and considered in my medical decision making (see chart for details).    MDM Rules/Calculators/A&P                          29 yo F with a chief complaints of a sore throat.  She was recently seen for a lesion to her tongue by an ear nose and throat physician.  Was told that her tonsils were enlarged.  Started on antibiotics and referred to follow-up.  She had also had a rash prior to that.  She is well-appearing and nontoxic.  No acute infectious cause in the throat on exam.  Rapid strep is negative.  I discussed with her about this possibly being mononucleosis she initially requested testing and then later deferred.  We will have her follow up with her ENT.  Dose of Decadron here.  8:41 PM:  I have discussed the diagnosis/risks/treatment options with the patient and believe the pt to be eligible for discharge home to follow-up with ENT. We also discussed returning to the ED immediately if new or worsening sx occur. We discussed the sx which are most concerning (e.g., sudden worsening pain, fever, inability to tolerate by mouth) that necessitate immediate return. Medications administered to the patient during their visit and any new prescriptions provided to the patient are listed  below.  Medications given during this visit Medications  lidocaine (XYLOCAINE) 2 % viscous mouth solution 15 mL (has no administration in time range)  dexamethasone (DECADRON) tablet 10 mg (has no administration in time range)     The patient appears reasonably screen and/or stabilized for discharge and I doubt any other medical condition or other [facility] requiring further screening, evaluation, or treatment in the ED at this time prior to discharge.   Final Clinical Impression(s) / ED Diagnoses Final diagnoses:  Sore throat  Rash and nonspecific skin eruption    Rx / DC Orders ED Discharge Orders    None       Melene Plan, DO 10/15/19 2041

## 2019-10-15 NOTE — ED Triage Notes (Addendum)
Patient c/o sore throat x 2 weeks. Patient states she was prescribed Amoxicillin today. Patient also saw the ENT last week as well. Patient states she finished a round of Prednisone yesterday.

## 2019-11-15 DIAGNOSIS — R899 Unspecified abnormal finding in specimens from other organs, systems and tissues: Secondary | ICD-10-CM | POA: Diagnosis not present

## 2020-01-24 DIAGNOSIS — K219 Gastro-esophageal reflux disease without esophagitis: Secondary | ICD-10-CM | POA: Diagnosis not present

## 2020-02-01 ENCOUNTER — Other Ambulatory Visit: Payer: Self-pay | Admitting: Family Medicine

## 2020-02-01 DIAGNOSIS — R0602 Shortness of breath: Secondary | ICD-10-CM

## 2020-02-01 DIAGNOSIS — K219 Gastro-esophageal reflux disease without esophagitis: Secondary | ICD-10-CM | POA: Diagnosis not present

## 2020-02-01 DIAGNOSIS — R0689 Other abnormalities of breathing: Secondary | ICD-10-CM | POA: Diagnosis not present

## 2020-02-01 DIAGNOSIS — F419 Anxiety disorder, unspecified: Secondary | ICD-10-CM | POA: Diagnosis not present

## 2020-02-02 ENCOUNTER — Ambulatory Visit
Admission: RE | Admit: 2020-02-02 | Discharge: 2020-02-02 | Disposition: A | Discharge status: Home / Self Care | Payer: BC Managed Care – PPO | Source: Ambulatory Visit | Attending: Family Medicine | Admitting: Family Medicine

## 2020-02-02 DIAGNOSIS — R079 Chest pain, unspecified: Secondary | ICD-10-CM | POA: Diagnosis not present

## 2020-02-02 DIAGNOSIS — Z113 Encounter for screening for infections with a predominantly sexual mode of transmission: Secondary | ICD-10-CM | POA: Diagnosis not present

## 2020-02-02 DIAGNOSIS — R0602 Shortness of breath: Secondary | ICD-10-CM

## 2020-02-02 DIAGNOSIS — Z6826 Body mass index (BMI) 26.0-26.9, adult: Secondary | ICD-10-CM | POA: Diagnosis not present

## 2020-02-02 MED ORDER — IOPAMIDOL (ISOVUE-370) INJECTION 76%
75.0000 mL | Freq: Once | INTRAVENOUS | Status: AC | PRN
  Administered 2020-02-02: 75 mL via INTRAVENOUS

## 2020-03-24 DIAGNOSIS — F419 Anxiety disorder, unspecified: Secondary | ICD-10-CM | POA: Diagnosis not present

## 2020-03-24 DIAGNOSIS — U071 COVID-19: Secondary | ICD-10-CM | POA: Diagnosis not present

## 2020-03-24 DIAGNOSIS — J22 Unspecified acute lower respiratory infection: Secondary | ICD-10-CM | POA: Diagnosis not present

## 2020-03-31 DIAGNOSIS — R0602 Shortness of breath: Secondary | ICD-10-CM | POA: Diagnosis not present

## 2020-03-31 DIAGNOSIS — U071 COVID-19: Secondary | ICD-10-CM | POA: Diagnosis not present

## 2020-03-31 DIAGNOSIS — R059 Cough, unspecified: Secondary | ICD-10-CM | POA: Diagnosis not present

## 2020-04-11 DIAGNOSIS — U071 COVID-19: Secondary | ICD-10-CM | POA: Diagnosis not present

## 2020-04-14 DIAGNOSIS — F419 Anxiety disorder, unspecified: Secondary | ICD-10-CM | POA: Diagnosis not present

## 2020-05-02 DIAGNOSIS — Z6826 Body mass index (BMI) 26.0-26.9, adult: Secondary | ICD-10-CM | POA: Diagnosis not present

## 2020-05-02 DIAGNOSIS — R350 Frequency of micturition: Secondary | ICD-10-CM | POA: Diagnosis not present

## 2020-05-02 DIAGNOSIS — Z113 Encounter for screening for infections with a predominantly sexual mode of transmission: Secondary | ICD-10-CM | POA: Diagnosis not present

## 2020-05-02 DIAGNOSIS — R87810 Cervical high risk human papillomavirus (HPV) DNA test positive: Secondary | ICD-10-CM | POA: Diagnosis not present

## 2020-05-02 DIAGNOSIS — Z01419 Encounter for gynecological examination (general) (routine) without abnormal findings: Secondary | ICD-10-CM | POA: Diagnosis not present

## 2020-05-02 DIAGNOSIS — Z124 Encounter for screening for malignant neoplasm of cervix: Secondary | ICD-10-CM | POA: Diagnosis not present

## 2020-05-30 DIAGNOSIS — Z411 Encounter for cosmetic surgery: Secondary | ICD-10-CM | POA: Diagnosis not present

## 2020-05-30 DIAGNOSIS — Z01810 Encounter for preprocedural cardiovascular examination: Secondary | ICD-10-CM | POA: Diagnosis not present

## 2020-06-01 DIAGNOSIS — R87610 Atypical squamous cells of undetermined significance on cytologic smear of cervix (ASC-US): Secondary | ICD-10-CM | POA: Diagnosis not present

## 2020-06-01 DIAGNOSIS — R87810 Cervical high risk human papillomavirus (HPV) DNA test positive: Secondary | ICD-10-CM | POA: Diagnosis not present

## 2020-06-01 DIAGNOSIS — Z3202 Encounter for pregnancy test, result negative: Secondary | ICD-10-CM | POA: Diagnosis not present

## 2020-06-01 DIAGNOSIS — N870 Mild cervical dysplasia: Secondary | ICD-10-CM | POA: Diagnosis not present

## 2020-06-01 DIAGNOSIS — Z6826 Body mass index (BMI) 26.0-26.9, adult: Secondary | ICD-10-CM | POA: Diagnosis not present

## 2020-06-13 DIAGNOSIS — R102 Pelvic and perineal pain: Secondary | ICD-10-CM | POA: Diagnosis not present

## 2020-06-24 DIAGNOSIS — Z03818 Encounter for observation for suspected exposure to other biological agents ruled out: Secondary | ICD-10-CM | POA: Diagnosis not present

## 2020-06-24 DIAGNOSIS — Z20822 Contact with and (suspected) exposure to covid-19: Secondary | ICD-10-CM | POA: Diagnosis not present

## 2020-06-27 DIAGNOSIS — Z20822 Contact with and (suspected) exposure to covid-19: Secondary | ICD-10-CM | POA: Diagnosis not present

## 2020-06-27 DIAGNOSIS — R0981 Nasal congestion: Secondary | ICD-10-CM | POA: Diagnosis not present

## 2020-09-27 DIAGNOSIS — R079 Chest pain, unspecified: Secondary | ICD-10-CM | POA: Diagnosis not present

## 2020-10-15 ENCOUNTER — Telehealth (HOSPITAL_BASED_OUTPATIENT_CLINIC_OR_DEPARTMENT_OTHER): Payer: Self-pay | Admitting: Emergency Medicine

## 2020-10-15 ENCOUNTER — Emergency Department (HOSPITAL_COMMUNITY)
Admission: EM | Admit: 2020-10-15 | Discharge: 2020-10-15 | Disposition: A | Discharge status: Home / Self Care | Payer: BC Managed Care – PPO | Attending: Emergency Medicine | Admitting: Emergency Medicine

## 2020-10-15 ENCOUNTER — Ambulatory Visit (HOSPITAL_COMMUNITY): Admission: RE | Admit: 2020-10-15 | Payer: BC Managed Care – PPO | Source: Ambulatory Visit

## 2020-10-15 ENCOUNTER — Ambulatory Visit (HOSPITAL_BASED_OUTPATIENT_CLINIC_OR_DEPARTMENT_OTHER)
Admission: RE | Admit: 2020-10-15 | Discharge: 2020-10-15 | Disposition: A | Discharge status: Home / Self Care | Payer: BC Managed Care – PPO | Source: Ambulatory Visit | Attending: Emergency Medicine | Admitting: Emergency Medicine

## 2020-10-15 ENCOUNTER — Other Ambulatory Visit: Payer: Self-pay

## 2020-10-15 ENCOUNTER — Encounter (HOSPITAL_COMMUNITY): Payer: Self-pay | Admitting: Emergency Medicine

## 2020-10-15 DIAGNOSIS — Z87891 Personal history of nicotine dependence: Secondary | ICD-10-CM | POA: Diagnosis not present

## 2020-10-15 DIAGNOSIS — M7989 Other specified soft tissue disorders: Secondary | ICD-10-CM | POA: Diagnosis not present

## 2020-10-15 DIAGNOSIS — M79661 Pain in right lower leg: Secondary | ICD-10-CM | POA: Insufficient documentation

## 2020-10-15 DIAGNOSIS — M79604 Pain in right leg: Secondary | ICD-10-CM | POA: Diagnosis not present

## 2020-10-15 NOTE — Discharge Instructions (Addendum)
You need to come back in the morning to have an ultrasound to rule out DVT (blood clot).  If this test is normal, you need to follow-up with your doctor.

## 2020-10-15 NOTE — ED Provider Notes (Signed)
[facility]-EMERGENC[facility] DEPT Provider Note   CSN: [id number] Arrival date & time: 10/15/20  0108     History Chief Complaint  Patient presents with   calf pain    Robin Aguirre is a 30 y.o. female.  Patient presents to the emergency department with a chief complaint of right calf pain.  She states the symptoms started today.  Denies any known injury.  Denies any history of PE or DVT.  Denies any recent long travel, surgery, or immobilization.  Denies any successful treatments.  She has tried applying heat.  The history is provided by the patient. No language interpreter was used.      Past Medical History:  Diagnosis Date   Colitis, chronic, ulcerative (HCC)    GERD (gastroesophageal reflux disease)    Headache     Patient Active Problem List   Diagnosis Date Noted   MDD (major depressive disorder), recurrent severe, without psychosis (HCC) 10/23/2014   Headache disorder 09/14/2014   Dizzy 09/14/2014   Blurry vision, bilateral 09/14/2014   Pulsatile tinnitus of both ears 09/14/2014   Perceived hearing changes 09/14/2014   Vision, loss, sudden 09/14/2014    Past Surgical History:  Procedure Laterality Date   COLONOSCOPY  January 2016   removal of IUD     WISDOM TOOTH EXTRACTION  March 2016     OB History     Gravida  2   Para  2   Term  2   Preterm      AB      Living  2      SAB      IAB      Ectopic      Multiple      Live Births  1           Family History  Problem Relation Age of Onset   Healthy Mother    Healthy Father    Cancer Maternal Grandmother    Migraines Neg Hx     Social History   Tobacco Use   Smoking status: Former    Packs/day: 0.25    Years: 10.00    Pack years: 2.50    Types: Cigarettes   Smokeless tobacco: Never  Vaping Use   Vaping Use: Never used  Substance Use Topics   Alcohol use: No    Alcohol/week: 0.0 standard drinks   Drug use: Yes    Types: Benzodiazepines, Marijuana     Comment: Stopped smoking marijuana on 08/17/14    Home Medications Prior to Admission medications   Not on File    Allergies    Patient has no known allergies.  Review of Systems   Review of Systems  All other systems reviewed and are negative.  Physical Exam Updated Vital Signs BP 97/66 (BP Location: Left Arm)   Pulse 87   Temp 98.1 F (36.7 C) (Oral)   Resp 18   Ht 5\' 5"  (1.651 m)   Wt 72.6 kg   LMP 10/08/2020   SpO2 97%   BMI 26.63 kg/m   Physical Exam Vitals and nursing note reviewed.  Constitutional:      General: She is not in acute distress.    Appearance: She is well-developed.  HENT:     Head: Normocephalic and atraumatic.  Eyes:     Conjunctiva/sclera: Conjunctivae normal.  Cardiovascular:     Rate and Rhythm: Normal rate.  Pulmonary:     Effort: Pulmonary effort is normal. No respiratory distress.  Abdominal:     General: There is no distension.  Musculoskeletal:     Cervical back: Neck supple.     Comments: No focal calf tenderness No deformity on exam Negative Thompson test  Skin:    General: Skin is warm and dry.  Neurological:     Mental Status: She is alert and oriented to person, place, and time.  Psychiatric:        Mood and Affect: Mood normal.        Behavior: Behavior normal.    ED Results / Procedures / Treatments   Labs (all labs ordered are listed, but only abnormal results are displayed) Labs Reviewed - No data to display  EKG None  Radiology No results found.  Procedures Procedures   Medications Ordered in ED Medications - No data to display  ED Course  I have reviewed the triage vital signs and the nursing notes.  Pertinent labs & imaging results that were available during my care of the patient were reviewed by me and considered in my medical decision making (see chart for details).    MDM Rules/Calculators/A&P                           Patient here with right calf pain.  Not significantly tender.  No  erythema, no evidence of cellulitis or abscess.  No injuries reported.  Achilles tendon is intact.  Will send for ultrasound in the morning.  Patient denies any new chest pain or shortness of breath. Final Clinical Impression(s) / ED Diagnoses Final diagnoses:  Right calf pain    Rx / DC Orders ED Discharge Orders          Ordered    [facility]        10/15/20 0224             Shahana, [place], PA-C 10/15/20 0229    Palumbo, April, MD 10/15/20 [id number]

## 2020-10-15 NOTE — ED Triage Notes (Signed)
Patient complaining of left calf pain that started 10/14/2020. Patient has not have injury to area.

## 2020-10-16 ENCOUNTER — Ambulatory Visit (HOSPITAL_COMMUNITY): Admission: RE | Admit: 2020-10-16 | Payer: BC Managed Care – PPO | Source: Ambulatory Visit

## 2020-10-17 DIAGNOSIS — L400 Psoriasis vulgaris: Secondary | ICD-10-CM | POA: Diagnosis not present

## 2020-10-17 DIAGNOSIS — Z79899 Other long term (current) drug therapy: Secondary | ICD-10-CM | POA: Diagnosis not present

## 2020-10-30 NOTE — Telephone Encounter (Signed)
DVT study negative. °

## 2020-11-01 DIAGNOSIS — Z79899 Other long term (current) drug therapy: Secondary | ICD-10-CM | POA: Diagnosis not present

## 2020-11-01 DIAGNOSIS — L409 Psoriasis, unspecified: Secondary | ICD-10-CM | POA: Diagnosis not present

## 2020-11-06 DIAGNOSIS — K5190 Ulcerative colitis, unspecified, without complications: Secondary | ICD-10-CM | POA: Diagnosis not present

## 2020-11-06 DIAGNOSIS — G43909 Migraine, unspecified, not intractable, without status migrainosus: Secondary | ICD-10-CM | POA: Diagnosis not present

## 2020-11-06 DIAGNOSIS — F419 Anxiety disorder, unspecified: Secondary | ICD-10-CM | POA: Diagnosis not present

## 2020-11-28 DIAGNOSIS — Z6825 Body mass index (BMI) 25.0-25.9, adult: Secondary | ICD-10-CM | POA: Diagnosis not present

## 2020-11-28 DIAGNOSIS — R102 Pelvic and perineal pain: Secondary | ICD-10-CM | POA: Diagnosis not present

## 2020-11-28 DIAGNOSIS — Z113 Encounter for screening for infections with a predominantly sexual mode of transmission: Secondary | ICD-10-CM | POA: Diagnosis not present

## 2020-12-26 DIAGNOSIS — N760 Acute vaginitis: Secondary | ICD-10-CM | POA: Diagnosis not present

## 2020-12-26 DIAGNOSIS — Z113 Encounter for screening for infections with a predominantly sexual mode of transmission: Secondary | ICD-10-CM | POA: Diagnosis not present

## 2021-02-20 DIAGNOSIS — R079 Chest pain, unspecified: Secondary | ICD-10-CM | POA: Diagnosis not present

## 2021-02-20 DIAGNOSIS — R000 Tachycardia, unspecified: Secondary | ICD-10-CM | POA: Diagnosis not present

## 2021-02-26 ENCOUNTER — Other Ambulatory Visit: Payer: Self-pay

## 2021-02-26 ENCOUNTER — Emergency Department (HOSPITAL_COMMUNITY)
Admission: EM | Admit: 2021-02-26 | Discharge: 2021-02-26 | Disposition: A | Discharge status: Home / Self Care | Payer: BC Managed Care – PPO | Attending: Emergency Medicine | Admitting: Emergency Medicine

## 2021-02-26 ENCOUNTER — Encounter (HOSPITAL_COMMUNITY): Payer: Self-pay | Admitting: Emergency Medicine

## 2021-02-26 ENCOUNTER — Emergency Department (HOSPITAL_COMMUNITY): Payer: BC Managed Care – PPO

## 2021-02-26 DIAGNOSIS — R0602 Shortness of breath: Secondary | ICD-10-CM | POA: Insufficient documentation

## 2021-02-26 DIAGNOSIS — R42 Dizziness and giddiness: Secondary | ICD-10-CM | POA: Diagnosis not present

## 2021-02-26 DIAGNOSIS — Z5321 Procedure and treatment not carried out due to patient leaving prior to being seen by health care provider: Secondary | ICD-10-CM | POA: Insufficient documentation

## 2021-02-26 DIAGNOSIS — R079 Chest pain, unspecified: Secondary | ICD-10-CM | POA: Diagnosis not present

## 2021-02-26 DIAGNOSIS — R0789 Other chest pain: Secondary | ICD-10-CM | POA: Insufficient documentation

## 2021-02-26 LAB — COMPREHENSIVE METABOLIC PANEL
ALT: 10 U/L (ref 0–44)
AST: 13 U/L — ABNORMAL LOW (ref 15–41)
Albumin: 4.2 g/dL (ref 3.5–5.0)
Alkaline Phosphatase: 46 U/L (ref 38–126)
Anion gap: 10 (ref 5–15)
BUN: 7 mg/dL (ref 6–20)
CO2: 22 mmol/L (ref 22–32)
Calcium: 9.1 mg/dL (ref 8.9–10.3)
Chloride: 104 mmol/L (ref 98–111)
Creatinine, Ser: 0.71 mg/dL (ref 0.44–1.00)
GFR, Estimated: >60 mL/min (ref >60)
Glucose, Bld: 93 mg/dL (ref 70–99)
Potassium: 3.4 mmol/L — ABNORMAL LOW (ref 3.5–5.1)
Sodium: 136 mmol/L (ref 135–145)
Total Bilirubin: 0.8 mg/dL (ref 0.3–1.2)
Total Protein: 7.2 g/dL (ref 6.5–8.1)

## 2021-02-26 LAB — CBC
HCT: 40.4 % (ref 36.0–46.0)
Hemoglobin: 13.6 g/dL (ref 12.0–15.0)
MCH: 31.3 pg (ref 26.0–34.0)
MCHC: 33.7 g/dL (ref 30.0–36.0)
MCV: 93.1 fL (ref 80.0–100.0)
Platelets: 298 10*3/uL (ref 150–400)
RBC: 4.34 MIL/uL (ref 3.87–5.11)
RDW: 11.6 % (ref 11.5–15.5)
WBC: 7.4 10*3/uL (ref 4.0–10.5)
nRBC: 0 % (ref 0.0–0.2)

## 2021-02-26 LAB — TROPONIN I (HIGH SENSITIVITY)
Troponin I (High Sensitivity): <2 ng/L (ref <18)
Troponin I (High Sensitivity): <2 ng/L (ref <18)

## 2021-02-26 LAB — TSH: TSH: 1.886 u[IU]/mL (ref 0.350–4.500)

## 2021-02-26 LAB — I-STAT BETA HCG BLOOD, ED (MC, WL, AP ONLY): I-stat hCG, quantitative: <5 m[IU]/mL (ref <5)

## 2021-02-26 NOTE — ED Notes (Signed)
Pt told registration she was LWBS

## 2021-02-26 NOTE — ED Provider Notes (Signed)
Emergency Medicine Provider Triage Evaluation Note  Robin Aguirre , a 30 y.o. female  was evaluated in triage.  Pt complains of chest pain x 2 weeks. Started after an episode of palpitations with tachycardia on her at home pulse oximeter. Since has been having constant tightness to the chest, worse after she eats, with associated intermittent palpitations and lightheadedness. Reports she is always somewhat short of breath but this is not new.   Review of Systems  Positive: Chest pain, palpitations, lightheadedness, dyspnea Negative: Syncope, hemoptysis, leg pain/swelling.   Physical Exam  BP 104/76   Pulse 62   Temp 98.2 F (36.8 C) (Oral)   Resp 16   Ht 5\' 5"  (1.651 m)   Wt 72 kg   LMP 02/20/2021   SpO2 100%   BMI 26.41 kg/m  Gen:   Awake, no distress  Resp:  Normal effort  MSK:   Moves extremities without difficulty  Other:  Heart RRR  Medical Decision Making  Medically screening exam initiated at 1:20 AM.  Appropriate orders placed.  TENIKA KEERAN was informed that the remainder of the evaluation will be completed by another provider, this initial triage assessment does not replace that evaluation, and the importance of remaining in the ED until their evaluation is complete.  Chest pain   Eyvonne Mechanic 02/26/21 14/12/22, MD 02/26/21 [phone]

## 2021-02-26 NOTE — ED Triage Notes (Signed)
Patient reports left chest pain onset last week , non radiating /mild SOB and lightheaded , no nausea or diaphoresis , no cough or fever .

## 2021-03-08 DIAGNOSIS — R000 Tachycardia, unspecified: Secondary | ICD-10-CM | POA: Diagnosis not present

## 2021-03-26 NOTE — Progress Notes (Deleted)
No chief complaint on file.     History of Present Illness: 31 yo female with history of GERD and ulcerative colitis here today as a new consult, referred by Dr. ***, for evaluation of chest pain. She was seen in the [facility] ED 02/26/21 with chest pain. Troponin negative. Her pain is associated with meals.   Primary Care Physician: [facility]   Past Medical History:  Diagnosis Date   Chest pain    Colitis, chronic, ulcerative ([place])    GERD (gastroesophageal reflux disease)    Headache     Past Surgical History:  Procedure Laterality Date   COLONOSCOPY  January 2016   removal of IUD     WISDOM TOOTH EXTRACTION  March 2016    Current Outpatient Medications  Medication Sig Dispense Refill   Acetaminophen (MIDOL PO) Take 2 mg by mouth every 4 (four) hours as needed (cramps).     Famotidine (PEPCID AC PO) Take 1 tablet by mouth 2 (two) times daily as needed (heartburn/indigestion).     Risankizumab-rzaa (SKYRIZI PEN) 150 MG/ML SOAJ Inject 150 mg into the skin See admin instructions. Every 3 months     No current facility-administered medications for this visit.    No Known Allergies  Social History   Socioeconomic History   Marital status: Single    Spouse name: Not on file   Number of children: 0   Years of education: Not on file   Highest education level: Not on file  Occupational History   Not on file  Tobacco Use   Smoking status: Former    Packs/day: 0.25    Years: 10.00    Pack years: 2.50    Types: Cigarettes   Smokeless tobacco: Never  Vaping Use   Vaping Use: Never used  Substance and Sexual Activity   Alcohol use: No    Alcohol/week: 0.0 standard drinks   Drug use: Yes    Types: Benzodiazepines, Marijuana    Comment: Stopped smoking marijuana on 08/17/14   Sexual activity: Yes    Birth control/protection: Implant  Other Topics Concern   Not on file  Social History Narrative   Lives at home with grandparents and children.     Caffeine use: Drinks a few sodas a few times per week    Social Determinants of Health   Financial Resource Strain: Not on file  Food Insecurity: Not on file  Transportation Needs: Not on file  Physical Activity: Not on file  Stress: Not on file  Social Connections: Not on file  Intimate Partner Violence: Not on file    Family History  Problem Relation Age of Onset   Healthy Mother    Healthy Father    Cancer Maternal Grandmother    Migraines Neg Hx     Review of Systems:  As stated in the HPI and otherwise negative.   There were no vitals taken for this visit.  Physical Examination: General: Well developed, well nourished, NAD  HEENT: OP clear, mucus membranes moist  SKIN: warm, dry. No rashes. Neuro: No focal deficits  Musculoskeletal: Muscle strength 5/5 all ext  Psychiatric: Mood and affect normal  Neck: No JVD, no carotid bruits, no thyromegaly, no lymphadenopathy.  Lungs:Clear bilaterally, no wheezes, rhonci, crackles Cardiovascular: Regular rate and rhythm. No murmurs, gallops or rubs. Abdomen:Soft. Bowel sounds present. Non-tender.  Extremities: No lower extremity edema. Pulses are 2 + in the bilateral DP/PT.  EKG:  EKG {ACTION; IS/IS GI:087931 ordered today. The  ekg ordered today demonstrates ***  Recent Labs: 02/26/2021: ALT 10; BUN 7; Creatinine, Ser 0.71; Hemoglobin 13.6; Platelets 298; Potassium 3.4; Sodium 136; TSH 1.886   Lipid Panel No results found for: CHOL, TRIG, HDL, CHOLHDL, VLDL, LDLCALC, LDLDIRECT   Wt Readings from Last 3 Encounters:  02/26/21 158 lb 11.7 oz (72 kg)  10/15/20 160 lb (72.6 kg)  10/15/19 171 lb (77.6 kg)     Other studies Reviewed: Additional studies/ records that were reviewed today include: ***. Review of the above records demonstrates: ***   Assessment and Plan:   1.   Current medicines are reviewed at length with the patient today.  The patient {ACTIONS; HAS/DOES NOT HAVE:19233} concerns regarding  medicines.  The following changes have been made:  {PLAN; NO CHANGE:13088:s}  Labs/ tests ordered today include: *** No orders of the defined types were placed in this encounter.    Disposition:   F/U with *** in {gen number [id number] {TIME; UNITS DAY/WEEK/MONTH:19136}   Signed, Lauree Chandler, MD 03/26/2021 6:24 PM    [facility] [place], [address], [place]  [address] Phone: [phone]; Fax: [phone]

## 2021-03-27 ENCOUNTER — Ambulatory Visit: Payer: BC Managed Care – PPO | Admitting: Cardiovascular Disease

## 2021-05-09 DIAGNOSIS — K5190 Ulcerative colitis, unspecified, without complications: Secondary | ICD-10-CM | POA: Diagnosis not present

## 2021-06-13 DIAGNOSIS — R000 Tachycardia, unspecified: Secondary | ICD-10-CM | POA: Diagnosis not present

## 2021-06-13 DIAGNOSIS — R42 Dizziness and giddiness: Secondary | ICD-10-CM | POA: Diagnosis not present

## 2021-06-13 DIAGNOSIS — G43909 Migraine, unspecified, not intractable, without status migrainosus: Secondary | ICD-10-CM | POA: Diagnosis not present

## 2021-06-13 DIAGNOSIS — F419 Anxiety disorder, unspecified: Secondary | ICD-10-CM | POA: Diagnosis not present

## 2021-06-21 DIAGNOSIS — R87610 Atypical squamous cells of undetermined significance on cytologic smear of cervix (ASC-US): Secondary | ICD-10-CM | POA: Diagnosis not present

## 2021-06-21 DIAGNOSIS — Z113 Encounter for screening for infections with a predominantly sexual mode of transmission: Secondary | ICD-10-CM | POA: Diagnosis not present

## 2021-06-21 DIAGNOSIS — Z01419 Encounter for gynecological examination (general) (routine) without abnormal findings: Secondary | ICD-10-CM | POA: Diagnosis not present

## 2021-06-21 DIAGNOSIS — Z124 Encounter for screening for malignant neoplasm of cervix: Secondary | ICD-10-CM | POA: Diagnosis not present

## 2021-06-26 ENCOUNTER — Encounter: Payer: Self-pay | Admitting: Internal Medicine

## 2021-06-26 ENCOUNTER — Ambulatory Visit: Payer: BC Managed Care – PPO | Admitting: Internal Medicine

## 2021-06-26 ENCOUNTER — Ambulatory Visit (INDEPENDENT_AMBULATORY_CARE_PROVIDER_SITE_OTHER): Payer: BC Managed Care – PPO

## 2021-06-26 VITALS — BP 114/78 | HR 71 | Ht 65.0 in | Wt 168.4 lb

## 2021-06-26 DIAGNOSIS — R002 Palpitations: Secondary | ICD-10-CM

## 2021-06-26 NOTE — Progress Notes (Signed)
?Cardiology Office Note:   ? ?Date:  06/26/2021  ? ?ID:  Robin Aguirre, DOB 1990/05/31, MRN [id number] ? ?PCP:  [facility] ?  ?[facility] Providers ?Cardiologist:  Maisie Fus, MD    ? ?Referring MD: [facility]  ? ?No chief complaint on file. ?palpitations ? ?History of Present Illness:   ? ?Robin Aguirre is a 31 y.o. female with a hx of GERD, depression with psychosis referral for palpitations ? ?She reported atypical cp and and palpitations in the ED in December. TSH is normal. Her creatinine is normal. She reported some chest tightness and troponin was negative x2. She has multiple ED visits for minor symptoms. She is very low risk for cardiac disease. She says her heart rates can get up to 165 bpm.She endorses presyncope. She can feel dizzy with driving. It is infrequent is and happens every other week. She reports atypical chest pain. Her anxiety medicine helps. ? ?EKG showed sinus with sinus arrhythmia 02/26/2021. ?EKG 08/10/2017- sinus tachycardia ?EKG 10/04/2014- NSR ? ?Past Medical History:  ?Diagnosis Date  ? Chest pain   ? Colitis, chronic, ulcerative ([facility])   ? GERD (gastroesophageal reflux disease)   ? Headache   ? ? ?Past Surgical History:  ?Procedure Laterality Date  ? COLONOSCOPY  January 2016  ? removal of IUD    ? WISDOM TOOTH EXTRACTION  March 2016  ? ? ?Current Medications: ?Current Meds  ?Medication Sig  ? clonazePAM (KLONOPIN) 0.5 MG tablet Take 0.5 mg by mouth 2 (two) times daily as needed.  ? Risankizumab-rzaa (SKYRIZI PEN) 150 MG/ML SOAJ Inject 150 mg into the skin See admin instructions. Every 3 months  ?  ? ?Allergies:   Patient has no known allergies.  ? ?Social History  ? ?Socioeconomic History  ? Marital status: Single  ?  Spouse name: Not on file  ? Number of children: 0  ? Years of education: Not on file  ? Highest education level: Not on file  ?Occupational History  ? Not on file  ?Tobacco Use  ? Smoking status: Some Days  ?  Packs/day: 0.25   ?  Years: 10.00  ?  Pack years: 2.50  ?  Types: Cigarettes, E-cigarettes  ? Smokeless tobacco: Never  ?Vaping Use  ? Vaping Use: Never used  ?Substance and Sexual Activity  ? Alcohol use: No  ?  Alcohol/week: 0.0 standard drinks  ? Drug use: Yes  ?  Types: Benzodiazepines, Marijuana  ?  Comment: Stopped smoking marijuana on 08/17/14  ? Sexual activity: Yes  ?  Birth control/protection: Implant  ?Other Topics Concern  ? Not on file  ?Social History Narrative  ? Lives at home with grandparents and children.   ? Caffeine use: Drinks a few sodas a few times per week   ? ?Social Determinants of Health  ? ?Financial Resource Strain: Not on file  ?Food Insecurity: Not on file  ?Transportation Needs: Not on file  ?Physical Activity: Not on file  ?Stress: Not on file  ?Social Connections: Not on file  ?  ? ?Family History: ?The patient's family history includes Cancer in her maternal grandmother; Healthy in her father and mother. Robin is no history of Migraines. ? ?ROS:   ?Please see the history of present illness.    ? All other systems reviewed and are negative. ? ?EKGs/Labs/Other Studies Reviewed:   ? ?The following studies were reviewed today: ? ? ?EKG:  EKG is  ordered today.  The ekg ordered today demonstrates  ? ?EKG 06/26/2021-NSR ? ?Recent Labs: ?02/26/2021: ALT 10; BUN 7; Creatinine, Ser 0.71; Hemoglobin 13.6; Platelets 298; Potassium 3.4; Sodium 136; TSH 1.886  ?Recent Lipid Panel ?No results found for: CHOL, TRIG, HDL, CHOLHDL, VLDL, LDLCALC, LDLDIRECT ? ? ?Risk Assessment/Calculations:   ?  ? ?    ? ?Physical Exam:   ? ?VS:  ? ?Vitals:  ? 06/26/21 0814  ?BP: 114/78  ?Pulse: 71  ?SpO2: 98%  ? ? ? ?Wt Readings from Last 3 Encounters:  ?06/26/21 168 lb 6.4 oz (76.4 kg)  ?02/26/21 158 lb 11.7 oz (72 kg)  ?10/15/20 160 lb (72.6 kg)  ?  ? ?GEN:  Well nourished, well developed in no acute distress ?HEENT: Normal ?NECK: No JVD;  ?LYMPHATICS: No lymphadenopathy ?CARDIAC: RRR, no murmurs, rubs, gallops ?RESPIRATORY:  Clear  to auscultation without rales, wheezing or rhonchi  ?ABDOMEN: Soft, non-tender, non-distended ?MUSCULOSKELETAL:  No edema; No deformity  ?SKIN: Warm and dry ?NEUROLOGIC:  Alert and oriented x 3 ?PSYCHIATRIC:  Normal affect  ? ?ASSESSMENT:   ? ?Palpitations:She does not have high risk features including syncope c/f arrhythmia , family hx of SCD, or abnormalities on her EKGs.  Her symptoms are very infrequent. We can do a 7 day zio more for reassurance. I recommend further management for anxiety. ? ?Chest pain: this is atypical. Her work up has been negative.I do not think this is cardiac related. ? ? ?PLAN:   ? ?In order of problems listed above: ? ?7 day ziopatch ?Follow up as needed ? ?   ? ? ?Medication Adjustments/Labs and Tests Ordered: ?Current medicines are reviewed at length with the patient today.  Concerns regarding medicines are outlined above.  ?Orders Placed This Encounter  ?Procedures  ? LONG TERM MONITOR (3-14 DAYS)  ? EKG 12-Lead  ? ?No orders of the defined types were placed in this encounter. ? ? ?Patient Instructions  ?Medication Instructions:  ?No Changes In Medications at this time.  ?*If you need a refill on your cardiac medications before your next appointment, please call your pharmacy* ? ?Testing/Procedures: ? ?ZIO XT- Long Term Monitor Instructions  ? ?Your physician has requested you wear your ZIO patch monitor___7____days.  ? ?This is a single patch monitor.  [employer] supplies one patch monitor per enrollment.  Additional stickers are not available. ?  ?Please do not apply patch if you will be having a Nuclear Stress Test, Echocardiogram, Cardiac CT, MRI, or Chest Xray during the time frame you would be wearing the monitor. The patch cannot be worn during these tests.  You cannot remove and re-apply the ZIO XT patch monitor. ?  ?Your ZIO patch monitor will be sent USPS Priority mail from [facility] directly to your home address. The monitor may also be mailed to a PO BOX if home  delivery is not available.   It may take 3-5 days to receive your monitor after you have been enrolled. ?  ?Once you have received you monitor, please review enclosed instructions.  Your monitor has already been registered assigning a specific monitor serial # to you. ?  ?Applying the monitor  ? ?Shave hair from upper left chest. ?  ?Hold abrader disc by orange tab.  Rub abrader in 40 strokes over left upper chest as indicated in your monitor instructions. ?  ?Clean area with 4 enclosed alcohol pads .  Use all pads to assure are is cleaned thoroughly.  Let dry.  ? ?Apply patch as  indicated in monitor instructions.  Patch will be place under collarbone on left side of chest with arrow pointing upward. ?  ?Rub patch adhesive wings for 2 minutes.Remove white label marked "1".  Remove white label marked "2".  Rub patch adhesive wings for 2 additional minutes. ?  ?While looking in a mirror, press and release button in center of patch.  A small green light will flash 3-4 times .  This will be your only indicator the monitor has been turned on. ?    ?Do not shower for the first 24 hours.  You may shower after the first 24 hours. ?  ?Press button if you feel a symptom. You will hear a small click.  Record Date, Time and Symptom in the Patient Log Book. ?  ?When you are ready to remove patch, follow instructions on last 2 pages of Patient Log Book.  Stick patch monitor onto last page of Patient Log Book. ?  ?Place Patient Log Book in [facility] box.  Use locking tab on box and tape box closed securely.  The [employer] has [employer] on it.  Please place in mailbox as soon as possible.  Your physician should have your test results approximately 7 days after the monitor has been mailed back to [facility]. ?  ?Call [facility] at [phone] if you have questions regarding your ZIO XT patch monitor.  Call them immediately if you see an orange light blinking on your monitor. ?  ?If your monitor  falls off in less than 4 days contact our Monitor department at [phone].  If your monitor becomes loose or falls off after 4 days call [employer] at [phone] for suggestions on securing your mon

## 2021-06-26 NOTE — Patient Instructions (Signed)
Medication Instructions:  ?No Changes In Medications at this time. ?*If you need a refill on your cardiac medications before your next appointment, please call your pharmacy* ? ?Testing/Procedures: ? ?ZIO XT- Long Term Monitor Instructions  ? ?Your physician has requested you wear your ZIO patch monitor___7____days.  ? ?This is a single patch monitor.  [employer] supplies one patch monitor per enrollment.  Additional stickers are not available. ?  ?Please do not apply patch if you will be having a Nuclear Stress Test, Echocardiogram, Cardiac CT, MRI, or Chest Xray during the time frame you would be wearing the monitor. The patch cannot be worn during these tests.  You cannot remove and re-apply the ZIO XT patch monitor. ?  ?Your ZIO patch monitor will be sent USPS Priority mail from [employer] directly to your home address. The monitor may also be mailed to a PO BOX if home delivery is not available.   It may take 3-5 days to receive your monitor after you have been enrolled. ?  ?Once you have received you monitor, please review enclosed instructions.  Your monitor has already been registered assigning a specific monitor serial # to you. ?  ?Applying the monitor  ? ?Shave hair from upper left chest. ?  ?Hold abrader disc by orange tab.  Rub abrader in 40 strokes over left upper chest as indicated in your monitor instructions. ?  ?Clean area with 4 enclosed alcohol pads .  Use all pads to assure are is cleaned thoroughly.  Let dry.  ? ?Apply patch as indicated in monitor instructions.  Patch will be place under collarbone on left side of chest with arrow pointing upward. ?  ?Rub patch adhesive wings for 2 minutes.Remove white label marked "1".  Remove white label marked "2".  Rub patch adhesive wings for 2 additional minutes. ?  ?While looking in a mirror, press and release button in center of patch.  A small green light will flash 3-4 times .  This will be your only indicator the monitor has been turned on. ?     ?Do not shower for the first 24 hours.  You may shower after the first 24 hours. ?  ?Press button if you feel a symptom. You will hear a small click.  Record Date, Time and Symptom in the Patient Log Book. ?  ?When you are ready to remove patch, follow instructions on last 2 pages of Patient Log Book.  Stick patch monitor onto last page of Patient Log Book. ?  ?Place Patient Log Book in Blue box.  Use locking tab on box and tape box closed securely.  The [employer] box has prepaid postage on it.  Please place in mailbox as soon as possible.  Your physician should have your test results approximately 7 days after the monitor has been mailed back to [employer]. ?  ?Call [employer] Customer Care at [phone] if you have questions regarding your ZIO XT patch monitor.  Call them immediately if you see an orange light blinking on your monitor. ?  ?If your monitor falls off in less than 4 days contact our Monitor department at [phone].  If your monitor becomes loose or falls off after 4 days call [employer] at [phone] for suggestions on securing your monitor.  ? ?Follow-Up: ?At [facility], you and your health needs are our priority.  As part of our continuing mission to provide you with exceptional heart care, we have created designated Provider Care Teams.  These   Care Teams include your primary Cardiologist (physician) and Advanced Practice Providers (APPs -  Physician Assistants and Nurse Practitioners) who all work together to provide you with the care you need, when you need it. ? ?Your next appointment:   ?AS NEEDED  ? ?The format for your next appointment:   ?In Person ? ?Provider:   ?Branch, Mary E, MD   ? ? ?

## 2021-06-26 NOTE — Progress Notes (Unsigned)
Enrolled patient for a 7 day Zio XT monitor to be mailed to patients home.  

## 2021-06-27 ENCOUNTER — Telehealth: Payer: Self-pay | Admitting: Internal Medicine

## 2021-06-27 NOTE — Telephone Encounter (Signed)
New Message: ? ? ? ? ?Patient would like for Dr Verna Czech Nurse to call her please. She needs to tell her about a note she needs for work, concerning her office visit yesterday. She also some other things she needs for her disability. ?

## 2021-06-27 NOTE — Telephone Encounter (Signed)
Spoke with pt, she reports her medical doctor took her out of work and she needs the office note from yesterday sent into [employer] for her claim. Office note from yesterday faxed to [phone] with claim # [id number]. Patient made aware any follow up will be based on the results of the monitor. ?

## 2021-06-28 DIAGNOSIS — R002 Palpitations: Secondary | ICD-10-CM

## 2021-07-05 ENCOUNTER — Telehealth: Payer: Self-pay | Admitting: Internal Medicine

## 2021-07-05 NOTE — Telephone Encounter (Signed)
Patient wants to know if she can wear the monitor for one more week, as she didn't have an episode while she was wearing it.   ?She also misplaced the return box. She is wondering if she can drop it off at the office.  ?

## 2021-07-05 NOTE — Telephone Encounter (Signed)
Patient had concerns about wearing the Zio patch. She is not sure if the monitor is recording, and she lost the return box. I told her I would send the contact number for [employer] to her My Chart (she was driving). ?

## 2021-07-16 DIAGNOSIS — F411 Generalized anxiety disorder: Secondary | ICD-10-CM | POA: Diagnosis not present

## 2021-07-16 DIAGNOSIS — F419 Anxiety disorder, unspecified: Secondary | ICD-10-CM | POA: Diagnosis not present

## 2021-07-16 DIAGNOSIS — E876 Hypokalemia: Secondary | ICD-10-CM | POA: Diagnosis not present

## 2021-07-20 ENCOUNTER — Encounter: Payer: Self-pay | Admitting: Internal Medicine

## 2021-07-20 ENCOUNTER — Telehealth: Payer: Self-pay | Admitting: Internal Medicine

## 2021-07-20 NOTE — Telephone Encounter (Signed)
Maisie Fus, MD  Lindell Spar, RN; Baird Cancer, RN ?Cc: Tobin Chad, RN ?Caller: Unspecified (Today,  2:20 PM) ?Responded to patient. Told her zio was ok. No need for further testing and to work on anxiety and stress  ?

## 2021-07-20 NOTE — Telephone Encounter (Signed)
New Message: ? ? ? ? ?Patient said she wore the Monitor. She said she was advised not to do caffine and it was anxiety. She says she does not do any caffine and was not when she was wearing the Monitor. She also said she was on Anxiety medicine while she was wearing the Monitor. She wants to see if there are any other test that Dr Wyline Mood can order> She says she is still having the same symptoms.Marland Kitchen  ?

## 2021-07-20 NOTE — Telephone Encounter (Signed)
Spoke with patient of Dr. Wyline Mood who reports racing heart rate, palpitations, chest pain. Same symptoms as initial consult. She wore a heart monitor.  ? ?Resting HR this weekend was in 140s and BP 142/90. Normally BP is 120/80 ("pretty perfect") ? ?She also reported a "really bad pain in her heart" that lasted for a few seconds. She said she had to hold her breath for a few seconds. Described as if she strained a muscle in her heart. She was sitting watching TV during this.  ? ?Advised will send message to MD to review. She is wanting to know if there can be any imaging type testing of her heart done.  ?

## 2021-08-10 DIAGNOSIS — N72 Inflammatory disease of cervix uteri: Secondary | ICD-10-CM | POA: Diagnosis not present

## 2021-08-10 DIAGNOSIS — Z3202 Encounter for pregnancy test, result negative: Secondary | ICD-10-CM | POA: Diagnosis not present

## 2021-08-10 DIAGNOSIS — Z113 Encounter for screening for infections with a predominantly sexual mode of transmission: Secondary | ICD-10-CM | POA: Diagnosis not present

## 2021-08-10 DIAGNOSIS — R87810 Cervical high risk human papillomavirus (HPV) DNA test positive: Secondary | ICD-10-CM | POA: Diagnosis not present

## 2021-08-10 DIAGNOSIS — R87610 Atypical squamous cells of undetermined significance on cytologic smear of cervix (ASC-US): Secondary | ICD-10-CM | POA: Diagnosis not present

## 2021-08-20 DIAGNOSIS — K209 Esophagitis, unspecified without bleeding: Secondary | ICD-10-CM | POA: Diagnosis not present

## 2021-08-20 DIAGNOSIS — K2289 Other specified disease of esophagus: Secondary | ICD-10-CM | POA: Diagnosis not present

## 2021-08-20 DIAGNOSIS — K2970 Gastritis, unspecified, without bleeding: Secondary | ICD-10-CM | POA: Diagnosis not present

## 2021-08-20 DIAGNOSIS — D125 Benign neoplasm of sigmoid colon: Secondary | ICD-10-CM | POA: Diagnosis not present

## 2021-08-20 DIAGNOSIS — K648 Other hemorrhoids: Secondary | ICD-10-CM | POA: Diagnosis not present

## 2021-08-20 DIAGNOSIS — K5190 Ulcerative colitis, unspecified, without complications: Secondary | ICD-10-CM | POA: Diagnosis not present

## 2021-08-20 DIAGNOSIS — K319 Disease of stomach and duodenum, unspecified: Secondary | ICD-10-CM | POA: Diagnosis not present

## 2021-08-20 DIAGNOSIS — R0789 Other chest pain: Secondary | ICD-10-CM | POA: Diagnosis not present

## 2021-10-02 DIAGNOSIS — Z113 Encounter for screening for infections with a predominantly sexual mode of transmission: Secondary | ICD-10-CM | POA: Diagnosis not present

## 2021-10-02 DIAGNOSIS — Z6827 Body mass index (BMI) 27.0-27.9, adult: Secondary | ICD-10-CM | POA: Diagnosis not present

## 2021-10-16 DIAGNOSIS — J029 Acute pharyngitis, unspecified: Secondary | ICD-10-CM | POA: Diagnosis not present

## 2022-01-14 DIAGNOSIS — Z113 Encounter for screening for infections with a predominantly sexual mode of transmission: Secondary | ICD-10-CM | POA: Diagnosis not present

## 2022-03-21 DIAGNOSIS — Z6831 Body mass index (BMI) 31.0-31.9, adult: Secondary | ICD-10-CM | POA: Diagnosis not present

## 2022-03-21 DIAGNOSIS — Z113 Encounter for screening for infections with a predominantly sexual mode of transmission: Secondary | ICD-10-CM | POA: Diagnosis not present

## 2022-03-27 DIAGNOSIS — E559 Vitamin D deficiency, unspecified: Secondary | ICD-10-CM | POA: Diagnosis not present

## 2022-03-27 DIAGNOSIS — M25531 Pain in right wrist: Secondary | ICD-10-CM | POA: Diagnosis not present

## 2022-03-27 DIAGNOSIS — Z1159 Encounter for screening for other viral diseases: Secondary | ICD-10-CM | POA: Diagnosis not present

## 2022-03-27 DIAGNOSIS — M545 Low back pain, unspecified: Secondary | ICD-10-CM | POA: Diagnosis not present

## 2022-03-27 DIAGNOSIS — M546 Pain in thoracic spine: Secondary | ICD-10-CM | POA: Diagnosis not present

## 2022-03-27 DIAGNOSIS — Z6831 Body mass index (BMI) 31.0-31.9, adult: Secondary | ICD-10-CM | POA: Diagnosis not present

## 2022-03-27 DIAGNOSIS — R030 Elevated blood-pressure reading, without diagnosis of hypertension: Secondary | ICD-10-CM | POA: Diagnosis not present

## 2022-03-27 DIAGNOSIS — Z3200 Encounter for pregnancy test, result unknown: Secondary | ICD-10-CM | POA: Diagnosis not present

## 2022-03-27 DIAGNOSIS — M129 Arthropathy, unspecified: Secondary | ICD-10-CM | POA: Diagnosis not present

## 2022-03-27 DIAGNOSIS — Z131 Encounter for screening for diabetes mellitus: Secondary | ICD-10-CM | POA: Diagnosis not present

## 2022-03-27 DIAGNOSIS — M542 Cervicalgia: Secondary | ICD-10-CM | POA: Diagnosis not present

## 2022-03-27 DIAGNOSIS — Z79899 Other long term (current) drug therapy: Secondary | ICD-10-CM | POA: Diagnosis not present

## 2022-03-29 DIAGNOSIS — Z79899 Other long term (current) drug therapy: Secondary | ICD-10-CM | POA: Diagnosis not present

## 2022-04-03 DIAGNOSIS — R030 Elevated blood-pressure reading, without diagnosis of hypertension: Secondary | ICD-10-CM | POA: Diagnosis not present

## 2022-04-03 DIAGNOSIS — Z6831 Body mass index (BMI) 31.0-31.9, adult: Secondary | ICD-10-CM | POA: Diagnosis not present

## 2022-04-03 DIAGNOSIS — E559 Vitamin D deficiency, unspecified: Secondary | ICD-10-CM | POA: Diagnosis not present

## 2022-04-03 DIAGNOSIS — Z79899 Other long term (current) drug therapy: Secondary | ICD-10-CM | POA: Diagnosis not present

## 2022-04-03 DIAGNOSIS — Z3200 Encounter for pregnancy test, result unknown: Secondary | ICD-10-CM | POA: Diagnosis not present

## 2022-04-03 DIAGNOSIS — M545 Low back pain, unspecified: Secondary | ICD-10-CM | POA: Diagnosis not present

## 2022-04-07 ENCOUNTER — Emergency Department (HOSPITAL_BASED_OUTPATIENT_CLINIC_OR_DEPARTMENT_OTHER): Payer: BC Managed Care – PPO | Admitting: Radiology

## 2022-04-07 ENCOUNTER — Emergency Department (HOSPITAL_BASED_OUTPATIENT_CLINIC_OR_DEPARTMENT_OTHER)
Admission: EM | Admit: 2022-04-07 | Discharge: 2022-04-07 | Disposition: A | Discharge status: Home / Self Care | Payer: BC Managed Care – PPO | Attending: Medical | Admitting: Medical

## 2022-04-07 ENCOUNTER — Encounter (HOSPITAL_BASED_OUTPATIENT_CLINIC_OR_DEPARTMENT_OTHER): Payer: Self-pay | Admitting: Emergency Medicine

## 2022-04-07 ENCOUNTER — Other Ambulatory Visit: Payer: Self-pay

## 2022-04-07 DIAGNOSIS — E876 Hypokalemia: Secondary | ICD-10-CM | POA: Diagnosis not present

## 2022-04-07 DIAGNOSIS — R0789 Other chest pain: Secondary | ICD-10-CM | POA: Diagnosis not present

## 2022-04-07 DIAGNOSIS — R079 Chest pain, unspecified: Secondary | ICD-10-CM | POA: Diagnosis not present

## 2022-04-07 LAB — D-DIMER, QUANTITATIVE: D-Dimer, Quant: <0.27 ug/mL-FEU (ref 0.00–0.50)

## 2022-04-07 LAB — CBC
HCT: 40 % (ref 36.0–46.0)
Hemoglobin: 14 g/dL (ref 12.0–15.0)
MCH: 31.2 pg (ref 26.0–34.0)
MCHC: 35 g/dL (ref 30.0–36.0)
MCV: 89.1 fL (ref 80.0–100.0)
Platelets: 359 10*3/uL (ref 150–400)
RBC: 4.49 MIL/uL (ref 3.87–5.11)
RDW: 11.8 % (ref 11.5–15.5)
WBC: 6.6 10*3/uL (ref 4.0–10.5)
nRBC: 0 % (ref 0.0–0.2)

## 2022-04-07 LAB — BASIC METABOLIC PANEL
Anion gap: 8 (ref 5–15)
BUN: 7 mg/dL (ref 6–20)
CO2: 25 mmol/L (ref 22–32)
Calcium: 9.5 mg/dL (ref 8.9–10.3)
Chloride: 103 mmol/L (ref 98–111)
Creatinine, Ser: 0.7 mg/dL (ref 0.44–1.00)
GFR, Estimated: >60 mL/min (ref >60)
Glucose, Bld: 99 mg/dL (ref 70–99)
Potassium: 3.4 mmol/L — ABNORMAL LOW (ref 3.5–5.1)
Sodium: 136 mmol/L (ref 135–145)

## 2022-04-07 LAB — HCG, SERUM, QUALITATIVE: Preg, Serum: NEGATIVE

## 2022-04-07 LAB — TROPONIN I (HIGH SENSITIVITY)
Troponin I (High Sensitivity): <2 ng/L (ref <18)
Troponin I (High Sensitivity): <2 ng/L (ref <18)

## 2022-04-07 NOTE — ED Provider Notes (Signed)
[place] EMERGENCY DEPARTMENT AT [facility] Provider Note   CSN: [id number] Arrival date & time: 04/07/22  0844     History  Chief Complaint  Patient presents with   Chest Pain    Robin Aguirre is a 32 y.o. female history includes ulcerative colitis, GERD, MDD.  Patient presented to the ER today for evaluation of chest pain onset 5 AM this morning, no clear inciting event.  She describes a burning type sensation to the center of her chest, it has been constant since onset but worsens with deep breathing, she reports similar pain in the past with anxiety but never this severe.  She is unsure if anxiety is causing her pain or if the pain is causing her to feel anxious.  She does report starting new medication around a week ago, tramadol she was on 50 mg every 4 hours for chronic back pain that she has been experiencing for several years.  She has been going to [facility] medical clinic and they have been treating her for this back pain and 2 days ago increased her to 100 mg of tramadol every 4 hours.  She denies any change to her back pain but does report that she feels it is somewhat worsened over the past few months due to taking care of her grandmother at home.  She denies any fall or injury.  She denies any recent illness, cough/fever, hemoptysis, abdominal pain, nausea, vomiting, diarrhea, extremity swelling/color change, recent immobilization/travel, recent surgeries or any additional concerns.  Of note patient denies use of oral contraceptives she has been off of birth control for 2 years.  RN to update chart.  HPI     Home Medications Prior to Admission medications   Medication Sig Start Date End Date Taking? Authorizing Provider  Acetaminophen (MIDOL PO) Take 2 mg by mouth every 4 (four) hours as needed (cramps). Patient not taking: Reported on 06/26/2021    [provider]  clobetasol (TEMOVATE) 0.05 % external solution Apply topically 2 (two) times  daily. Patient not taking: Reported on 06/26/2021 06/12/21   [provider]  clonazePAM (KLONOPIN) 0.5 MG tablet Take 0.5 mg by mouth 2 (two) times daily as needed. 06/13/21   [provider]  Famotidine (PEPCID AC PO) Take 1 tablet by mouth 2 (two) times daily as needed (heartburn/indigestion). Patient not taking: Reported on 06/26/2021    [provider]  Risankizumab-rzaa (SKYRIZI PEN) 150 MG/ML SOAJ Inject 150 mg into the skin See admin instructions. Every 3 months    [provider]  sertraline (ZOLOFT) 50 MG tablet Take 50 mg by mouth daily. Patient not taking: Reported on 06/26/2021 06/20/21   [provider]  valACYclovir (VALTREX) 500 MG tablet Take 500 mg by mouth 2 (two) times daily. Patient not taking: Reported on 06/26/2021 06/21/21   [provider]      Allergies    Hydrocodone    Review of Systems   Review of Systems Ten systems are reviewed and are negative for acute change except as noted in the HPI  Physical Exam Updated Vital Signs BP 100/71   Pulse 70   Temp 98 F (36.7 C) (Oral)   Resp 12   SpO2 98%  Physical Exam Constitutional:      General: She is not in acute distress.    Appearance: Normal appearance. She is well-developed. She is not ill-appearing or diaphoretic.  HENT:     Head: Normocephalic and atraumatic.  Eyes:  General: Vision grossly intact. Gaze aligned appropriately.     Pupils: Pupils are equal, round, and reactive to light.  Neck:     Trachea: Trachea and phonation normal.  Cardiovascular:     Rate and Rhythm: Normal rate and regular rhythm.     Pulses:          Radial pulses are 2+ on the right side and 2+ on the left side.       Dorsalis pedis pulses are 2+ on the right side and 2+ on the left side.     Heart sounds: Normal heart sounds.  Pulmonary:     Effort: Pulmonary effort is normal. No respiratory distress.     Breath sounds: Normal breath sounds and air entry.  Abdominal:      General: There is no distension.     Palpations: Abdomen is soft.     Tenderness: There is no abdominal tenderness. There is no guarding or rebound.  Musculoskeletal:        General: Normal range of motion.     Cervical back: Normal range of motion.     Right lower leg: No tenderness. No edema.     Left lower leg: No tenderness. No edema.  Skin:    General: Skin is warm and dry.  Neurological:     Mental Status: She is alert.     GCS: GCS eye subscore is 4. GCS verbal subscore is 5. GCS motor subscore is 6.     Comments: Speech is clear and goal oriented, follows commands Major Cranial nerves without deficit, no facial droop Moves extremities without ataxia, coordination intact  Psychiatric:        Behavior: Behavior normal.     ED Results / Procedures / Treatments   Labs (all labs ordered are listed, but only abnormal results are displayed) Labs Reviewed  BASIC METABOLIC PANEL - Abnormal; Notable for the following components:      Result Value   Potassium 3.4 (*)    All other components within normal limits  CBC  D-DIMER, QUANTITATIVE  HCG, SERUM, QUALITATIVE  TROPONIN I (HIGH SENSITIVITY)  TROPONIN I (HIGH SENSITIVITY)    EKG EKG Interpretation  Date/Time:  Sunday April 07 2022 08:58:42 EST Ventricular Rate:  91 PR Interval:  166 QRS Duration: 100 QT Interval:  362 QTC Calculation: 445 R Axis:   84 Text Interpretation: Normal sinus rhythm Nonspecific T wave abnormality Abnormal ECG When compared with ECG of 26-Feb-2021 01:12, No significant change was found Confirmed by Fredia Sorrow [phone]) on 04/07/2022 10:19:47 AM  Radiology DG Chest 2 View  Result Date: 04/07/2022 CLINICAL DATA:  Chest pain EXAM: CHEST - 2 VIEW COMPARISON:  02/26/2021 FINDINGS: The heart size and mediastinal contours are within normal limits. Both lungs are clear. The visualized skeletal structures are unremarkable. IMPRESSION: No active cardiopulmonary disease. Electronically Signed   By:  Kerby Moors M.D.   On: 04/07/2022 09:54    Procedures Procedures    Medications Ordered in ED Medications - No data to display  ED Course/ Medical Decision Making/ A&P                             Medical Decision Making 32 year old female presented for chest pain and pleurisy onset 5 AM this morning.  No clear inciting event.  She does report starting new medication of the past week, tramadol which was increased to 100 mg every 4 hours by [facility]  clinic per patient.  She plans on stopping this medication she was prescribed due to chronic back pain times several years that is worsened over the past few months taking care of her grandmother.  She has no history of trauma.  No recent illness.  Vital signs within normal limits.  She is low risk by Wells criteria and PERC negative.  Differential includes but not limited to ACS, PE, dissection, PNA, PTX, GERD, anxiety, musculoskeletal chest pain.  Plan to obtain CBC, BMP, troponin, D-dimer, EKG, chest x-ray, pregnancy test.  Amount and/or Complexity of Data Reviewed Labs: ordered.    Details: High-sensitivity troponin negative x 2, doubt ACS Pregnancy test negative D-dimer negative, doubt DVT/PE CBC within normal limits.  Hemoglobin 14.0, no evidence for symptomatic anemia.  No leukocytosis to suggest infectious process. BMP shows mild hypokalemia at 3.4.  No emergent electrolyte derangement, AKI or gap. Radiology: ordered.    Details: I have personally reviewed and interpreted patient's two-view chest x-ray.  I do not appreciate any obvious PNA, PTX, pleural effusion or other acute cardiopulmonary process. ECG/medicine tests:     Details: I have personally reviewed and interpreted patient's twelve-lead EKG.  I do not appreciate any obvious acute ischemic changes.  Normal sinus rhythm rate 91 bpm.  Risk Risk Details: Atypical chest pain, possible musculoskeletal or anxiety component.  Patient's workup today is overall reassuring.  She was  reassessed she was sleeping comfortably bed no acute distress, vital signs within normal limits on room air.  Low suspicion for ACS, PE, dissection, PTX, PNA or other emergent causes of her chest pain.  Patient plans to discontinue tramadol, of note I do not have's appreciate any obvious signs of allergic reaction today.  I encouraged rest and hydration and close PCP follow-up.  Strict ER precautions were discussed.    At this time there does not appear to be any evidence of an acute emergency medical condition and the patient appears stable for discharge with appropriate outpatient follow up. Diagnosis was discussed with patient who verbalizes understanding of care plan and is agreeable to discharge. I have discussed return precautions with patient who verbalizes understanding. Patient encouraged to follow-up with their PCP. All questions answered.  Patient's case discussed with Dr. Rogene Houston who agrees with plan to discharge with follow-up.   Note: Portions of this report may have been transcribed using voice recognition software. Every effort was made to ensure accuracy; however, inadvertent computerized transcription errors may still be present.         Final Clinical Impression(s) / ED Diagnoses Final diagnoses:  Atypical chest pain    Rx / DC Orders ED Discharge Orders     None         Gari Crown 04/07/22 1309    Fredia Sorrow, MD 04/10/22 1902

## 2022-04-07 NOTE — Discharge Instructions (Signed)
At this time there does not appear to be the presence of an emergent medical condition, however there is always the potential for conditions to change. Please read and follow the below instructions.  Please return to the Emergency Department immediately for any new or worsening symptoms. Please be sure to follow up with your Primary Care Provider within one week regarding your visit today; please call their office to schedule an appointment even if you are feeling better for a follow-up visit.  Please read the additional information packets attached to your discharge summary.  Go to the nearest Emergency Department immediately if: You have fever or chills Your chest pain is worse. You have a cough that gets worse, or you cough up blood. You have very bad (severe) pain in your belly (abdomen). You pass out (faint). You have either of these for no clear reason: Sudden chest discomfort. Sudden discomfort in your arms, back, neck, or jaw. You have shortness of breath at any time. You suddenly start to sweat, or your skin gets clammy. You feel sick to your stomach (nauseous). You throw up (vomit). You suddenly feel lightheaded or dizzy. You feel very weak or tired. Your heart starts to beat fast, or it feels like it is skipping beats. You have any new/concerning or worsening of symptoms.  Do not take your medicine if  develop an itchy rash, swelling in your mouth or lips, or difficulty breathing; call 911 and seek immediate emergency medical attention if this occurs.  You may review your lab tests and imaging results in their entirety on your MyChart account.  Please discuss all results of fully with your primary care provider and other specialist at your follow-up visit.  Note: Portions of this text may have been transcribed using voice recognition software. Every effort was made to ensure accuracy; however, inadvertent computerized transcription errors may still be present.

## 2022-04-07 NOTE — ED Notes (Signed)
Discharge paperwork given and verbally understood. 

## 2022-04-07 NOTE — ED Triage Notes (Signed)
Chest pain center, hard to take deep breaths in due to feeling so tight. Just started taking tramadol a week ago and upped to 100mg  3 days ago. This is for back pain.

## 2022-04-07 NOTE — ED Notes (Signed)
Patient transported to X-ray 

## 2022-04-08 DIAGNOSIS — Z79899 Other long term (current) drug therapy: Secondary | ICD-10-CM | POA: Diagnosis not present

## 2022-04-11 DIAGNOSIS — Z79899 Other long term (current) drug therapy: Secondary | ICD-10-CM | POA: Diagnosis not present

## 2022-04-17 DIAGNOSIS — J029 Acute pharyngitis, unspecified: Secondary | ICD-10-CM | POA: Diagnosis not present

## 2022-04-24 DIAGNOSIS — R350 Frequency of micturition: Secondary | ICD-10-CM | POA: Diagnosis not present

## 2022-04-24 DIAGNOSIS — Z6829 Body mass index (BMI) 29.0-29.9, adult: Secondary | ICD-10-CM | POA: Diagnosis not present

## 2022-04-24 DIAGNOSIS — M545 Low back pain, unspecified: Secondary | ICD-10-CM | POA: Diagnosis not present

## 2022-04-24 DIAGNOSIS — N390 Urinary tract infection, site not specified: Secondary | ICD-10-CM | POA: Diagnosis not present

## 2022-04-24 DIAGNOSIS — G8929 Other chronic pain: Secondary | ICD-10-CM | POA: Diagnosis not present

## 2022-04-24 DIAGNOSIS — Z79899 Other long term (current) drug therapy: Secondary | ICD-10-CM | POA: Diagnosis not present

## 2022-04-24 DIAGNOSIS — M5137 Other intervertebral disc degeneration, lumbosacral region: Secondary | ICD-10-CM | POA: Diagnosis not present

## 2022-04-24 DIAGNOSIS — Z3200 Encounter for pregnancy test, result unknown: Secondary | ICD-10-CM | POA: Diagnosis not present

## 2022-04-29 DIAGNOSIS — Z79899 Other long term (current) drug therapy: Secondary | ICD-10-CM | POA: Diagnosis not present

## 2022-05-01 DIAGNOSIS — R350 Frequency of micturition: Secondary | ICD-10-CM | POA: Diagnosis not present

## 2022-05-01 DIAGNOSIS — R030 Elevated blood-pressure reading, without diagnosis of hypertension: Secondary | ICD-10-CM | POA: Diagnosis not present

## 2022-05-01 DIAGNOSIS — Z79899 Other long term (current) drug therapy: Secondary | ICD-10-CM | POA: Diagnosis not present

## 2022-05-01 DIAGNOSIS — Z6829 Body mass index (BMI) 29.0-29.9, adult: Secondary | ICD-10-CM | POA: Diagnosis not present

## 2022-05-01 DIAGNOSIS — M5137 Other intervertebral disc degeneration, lumbosacral region: Secondary | ICD-10-CM | POA: Diagnosis not present

## 2022-05-01 DIAGNOSIS — Z3200 Encounter for pregnancy test, result unknown: Secondary | ICD-10-CM | POA: Diagnosis not present

## 2022-05-06 DIAGNOSIS — Z79899 Other long term (current) drug therapy: Secondary | ICD-10-CM | POA: Diagnosis not present

## 2022-05-15 DIAGNOSIS — R350 Frequency of micturition: Secondary | ICD-10-CM | POA: Diagnosis not present

## 2022-05-15 DIAGNOSIS — G8929 Other chronic pain: Secondary | ICD-10-CM | POA: Diagnosis not present

## 2022-05-15 DIAGNOSIS — Z3200 Encounter for pregnancy test, result unknown: Secondary | ICD-10-CM | POA: Diagnosis not present

## 2022-05-15 DIAGNOSIS — R42 Dizziness and giddiness: Secondary | ICD-10-CM | POA: Diagnosis not present

## 2022-05-15 DIAGNOSIS — Z79899 Other long term (current) drug therapy: Secondary | ICD-10-CM | POA: Diagnosis not present

## 2022-05-15 DIAGNOSIS — Z6831 Body mass index (BMI) 31.0-31.9, adult: Secondary | ICD-10-CM | POA: Diagnosis not present

## 2022-05-15 DIAGNOSIS — M5137 Other intervertebral disc degeneration, lumbosacral region: Secondary | ICD-10-CM | POA: Diagnosis not present

## 2022-05-20 DIAGNOSIS — Z79899 Other long term (current) drug therapy: Secondary | ICD-10-CM | POA: Diagnosis not present

## 2022-06-12 DIAGNOSIS — G8929 Other chronic pain: Secondary | ICD-10-CM | POA: Diagnosis not present

## 2022-06-12 DIAGNOSIS — Z3200 Encounter for pregnancy test, result unknown: Secondary | ICD-10-CM | POA: Diagnosis not present

## 2022-06-12 DIAGNOSIS — R350 Frequency of micturition: Secondary | ICD-10-CM | POA: Diagnosis not present

## 2022-06-12 DIAGNOSIS — Z79899 Other long term (current) drug therapy: Secondary | ICD-10-CM | POA: Diagnosis not present

## 2022-06-12 DIAGNOSIS — M5137 Other intervertebral disc degeneration, lumbosacral region: Secondary | ICD-10-CM | POA: Diagnosis not present

## 2022-06-12 DIAGNOSIS — R030 Elevated blood-pressure reading, without diagnosis of hypertension: Secondary | ICD-10-CM | POA: Diagnosis not present

## 2022-06-12 DIAGNOSIS — Z6831 Body mass index (BMI) 31.0-31.9, adult: Secondary | ICD-10-CM | POA: Diagnosis not present

## 2022-06-12 DIAGNOSIS — M545 Low back pain, unspecified: Secondary | ICD-10-CM | POA: Diagnosis not present

## 2022-06-17 DIAGNOSIS — Z79899 Other long term (current) drug therapy: Secondary | ICD-10-CM | POA: Diagnosis not present

## 2022-06-27 DIAGNOSIS — M5137 Other intervertebral disc degeneration, lumbosacral region: Secondary | ICD-10-CM | POA: Diagnosis not present

## 2022-06-27 DIAGNOSIS — R42 Dizziness and giddiness: Secondary | ICD-10-CM | POA: Diagnosis not present

## 2022-06-27 DIAGNOSIS — F17200 Nicotine dependence, unspecified, uncomplicated: Secondary | ICD-10-CM | POA: Diagnosis not present

## 2022-06-27 DIAGNOSIS — Z6831 Body mass index (BMI) 31.0-31.9, adult: Secondary | ICD-10-CM | POA: Diagnosis not present

## 2022-06-27 DIAGNOSIS — G8929 Other chronic pain: Secondary | ICD-10-CM | POA: Diagnosis not present

## 2022-06-27 DIAGNOSIS — R030 Elevated blood-pressure reading, without diagnosis of hypertension: Secondary | ICD-10-CM | POA: Diagnosis not present

## 2022-06-27 DIAGNOSIS — F17210 Nicotine dependence, cigarettes, uncomplicated: Secondary | ICD-10-CM | POA: Diagnosis not present

## 2022-06-27 DIAGNOSIS — M545 Low back pain, unspecified: Secondary | ICD-10-CM | POA: Diagnosis not present

## 2022-07-07 DIAGNOSIS — G8929 Other chronic pain: Secondary | ICD-10-CM | POA: Diagnosis not present

## 2022-07-07 DIAGNOSIS — F17200 Nicotine dependence, unspecified, uncomplicated: Secondary | ICD-10-CM | POA: Diagnosis not present

## 2022-07-07 DIAGNOSIS — R030 Elevated blood-pressure reading, without diagnosis of hypertension: Secondary | ICD-10-CM | POA: Diagnosis not present

## 2022-07-07 DIAGNOSIS — M5137 Other intervertebral disc degeneration, lumbosacral region: Secondary | ICD-10-CM | POA: Diagnosis not present

## 2022-07-07 DIAGNOSIS — Z0289 Encounter for other administrative examinations: Secondary | ICD-10-CM | POA: Diagnosis not present

## 2022-07-07 DIAGNOSIS — Z6830 Body mass index (BMI) 30.0-30.9, adult: Secondary | ICD-10-CM | POA: Diagnosis not present

## 2022-07-07 DIAGNOSIS — M545 Low back pain, unspecified: Secondary | ICD-10-CM | POA: Diagnosis not present

## 2022-07-07 DIAGNOSIS — F17210 Nicotine dependence, cigarettes, uncomplicated: Secondary | ICD-10-CM | POA: Diagnosis not present

## 2022-07-12 DIAGNOSIS — R030 Elevated blood-pressure reading, without diagnosis of hypertension: Secondary | ICD-10-CM | POA: Diagnosis not present

## 2022-07-12 DIAGNOSIS — Z6830 Body mass index (BMI) 30.0-30.9, adult: Secondary | ICD-10-CM | POA: Diagnosis not present

## 2022-07-12 DIAGNOSIS — G8929 Other chronic pain: Secondary | ICD-10-CM | POA: Diagnosis not present

## 2022-07-12 DIAGNOSIS — E559 Vitamin D deficiency, unspecified: Secondary | ICD-10-CM | POA: Diagnosis not present

## 2022-07-12 DIAGNOSIS — M545 Low back pain, unspecified: Secondary | ICD-10-CM | POA: Diagnosis not present

## 2022-07-12 DIAGNOSIS — M5137 Other intervertebral disc degeneration, lumbosacral region: Secondary | ICD-10-CM | POA: Diagnosis not present

## 2022-07-12 DIAGNOSIS — R42 Dizziness and giddiness: Secondary | ICD-10-CM | POA: Diagnosis not present

## 2022-07-17 DIAGNOSIS — Z79899 Other long term (current) drug therapy: Secondary | ICD-10-CM | POA: Diagnosis not present

## 2022-08-09 DIAGNOSIS — Z79899 Other long term (current) drug therapy: Secondary | ICD-10-CM | POA: Diagnosis not present

## 2022-08-09 DIAGNOSIS — E559 Vitamin D deficiency, unspecified: Secondary | ICD-10-CM | POA: Diagnosis not present

## 2022-08-09 DIAGNOSIS — M545 Low back pain, unspecified: Secondary | ICD-10-CM | POA: Diagnosis not present

## 2022-08-09 DIAGNOSIS — R42 Dizziness and giddiness: Secondary | ICD-10-CM | POA: Diagnosis not present

## 2022-08-09 DIAGNOSIS — Z6831 Body mass index (BMI) 31.0-31.9, adult: Secondary | ICD-10-CM | POA: Diagnosis not present

## 2022-08-09 DIAGNOSIS — Z3200 Encounter for pregnancy test, result unknown: Secondary | ICD-10-CM | POA: Diagnosis not present

## 2022-08-09 DIAGNOSIS — G8929 Other chronic pain: Secondary | ICD-10-CM | POA: Diagnosis not present

## 2022-08-09 DIAGNOSIS — E6609 Other obesity due to excess calories: Secondary | ICD-10-CM | POA: Diagnosis not present

## 2022-08-09 DIAGNOSIS — R030 Elevated blood-pressure reading, without diagnosis of hypertension: Secondary | ICD-10-CM | POA: Diagnosis not present

## 2022-08-09 DIAGNOSIS — M5137 Other intervertebral disc degeneration, lumbosacral region: Secondary | ICD-10-CM | POA: Diagnosis not present

## 2022-08-14 DIAGNOSIS — Z79899 Other long term (current) drug therapy: Secondary | ICD-10-CM | POA: Diagnosis not present

## 2022-09-03 DIAGNOSIS — L400 Psoriasis vulgaris: Secondary | ICD-10-CM | POA: Diagnosis not present

## 2022-09-03 DIAGNOSIS — Z79899 Other long term (current) drug therapy: Secondary | ICD-10-CM | POA: Diagnosis not present

## 2022-09-06 DIAGNOSIS — Z6831 Body mass index (BMI) 31.0-31.9, adult: Secondary | ICD-10-CM | POA: Diagnosis not present

## 2022-09-06 DIAGNOSIS — E559 Vitamin D deficiency, unspecified: Secondary | ICD-10-CM | POA: Diagnosis not present

## 2022-09-06 DIAGNOSIS — Z3200 Encounter for pregnancy test, result unknown: Secondary | ICD-10-CM | POA: Diagnosis not present

## 2022-09-06 DIAGNOSIS — M5137 Other intervertebral disc degeneration, lumbosacral region: Secondary | ICD-10-CM | POA: Diagnosis not present

## 2022-09-06 DIAGNOSIS — Z79899 Other long term (current) drug therapy: Secondary | ICD-10-CM | POA: Diagnosis not present

## 2022-09-06 DIAGNOSIS — R42 Dizziness and giddiness: Secondary | ICD-10-CM | POA: Diagnosis not present

## 2022-09-10 DIAGNOSIS — Z79899 Other long term (current) drug therapy: Secondary | ICD-10-CM | POA: Diagnosis not present

## 2022-10-03 DIAGNOSIS — Z79899 Other long term (current) drug therapy: Secondary | ICD-10-CM | POA: Diagnosis not present

## 2022-10-03 DIAGNOSIS — Z6831 Body mass index (BMI) 31.0-31.9, adult: Secondary | ICD-10-CM | POA: Diagnosis not present

## 2022-10-03 DIAGNOSIS — R42 Dizziness and giddiness: Secondary | ICD-10-CM | POA: Diagnosis not present

## 2022-10-03 DIAGNOSIS — E559 Vitamin D deficiency, unspecified: Secondary | ICD-10-CM | POA: Diagnosis not present

## 2022-10-03 DIAGNOSIS — Z3200 Encounter for pregnancy test, result unknown: Secondary | ICD-10-CM | POA: Diagnosis not present

## 2022-10-03 DIAGNOSIS — M5137 Other intervertebral disc degeneration, lumbosacral region: Secondary | ICD-10-CM | POA: Diagnosis not present

## 2022-10-08 DIAGNOSIS — Z79899 Other long term (current) drug therapy: Secondary | ICD-10-CM | POA: Diagnosis not present

## 2022-11-28 DIAGNOSIS — Z113 Encounter for screening for infections with a predominantly sexual mode of transmission: Secondary | ICD-10-CM | POA: Diagnosis not present

## 2022-11-28 DIAGNOSIS — Z124 Encounter for screening for malignant neoplasm of cervix: Secondary | ICD-10-CM | POA: Diagnosis not present

## 2022-11-28 DIAGNOSIS — Z01419 Encounter for gynecological examination (general) (routine) without abnormal findings: Secondary | ICD-10-CM | POA: Diagnosis not present

## 2022-11-28 DIAGNOSIS — Z6831 Body mass index (BMI) 31.0-31.9, adult: Secondary | ICD-10-CM | POA: Diagnosis not present

## 2022-12-03 DIAGNOSIS — M5137 Other intervertebral disc degeneration, lumbosacral region: Secondary | ICD-10-CM | POA: Diagnosis not present

## 2022-12-03 DIAGNOSIS — Z79899 Other long term (current) drug therapy: Secondary | ICD-10-CM | POA: Diagnosis not present

## 2022-12-03 DIAGNOSIS — Z3200 Encounter for pregnancy test, result unknown: Secondary | ICD-10-CM | POA: Diagnosis not present

## 2022-12-06 DIAGNOSIS — Z79899 Other long term (current) drug therapy: Secondary | ICD-10-CM | POA: Diagnosis not present

## 2023-01-02 DIAGNOSIS — E559 Vitamin D deficiency, unspecified: Secondary | ICD-10-CM | POA: Diagnosis not present

## 2023-01-02 DIAGNOSIS — E6609 Other obesity due to excess calories: Secondary | ICD-10-CM | POA: Diagnosis not present

## 2023-01-02 DIAGNOSIS — M51379 Other intervertebral disc degeneration, lumbosacral region without mention of lumbar back pain or lower extremity pain: Secondary | ICD-10-CM | POA: Diagnosis not present

## 2023-01-02 DIAGNOSIS — R42 Dizziness and giddiness: Secondary | ICD-10-CM | POA: Diagnosis not present

## 2023-01-02 DIAGNOSIS — Z3200 Encounter for pregnancy test, result unknown: Secondary | ICD-10-CM | POA: Diagnosis not present

## 2023-01-02 DIAGNOSIS — Z79899 Other long term (current) drug therapy: Secondary | ICD-10-CM | POA: Diagnosis not present

## 2023-01-02 DIAGNOSIS — Z6831 Body mass index (BMI) 31.0-31.9, adult: Secondary | ICD-10-CM | POA: Diagnosis not present

## 2023-01-30 DIAGNOSIS — M51379 Other intervertebral disc degeneration, lumbosacral region without mention of lumbar back pain or lower extremity pain: Secondary | ICD-10-CM | POA: Diagnosis not present

## 2023-01-30 DIAGNOSIS — E6609 Other obesity due to excess calories: Secondary | ICD-10-CM | POA: Diagnosis not present

## 2023-01-30 DIAGNOSIS — R42 Dizziness and giddiness: Secondary | ICD-10-CM | POA: Diagnosis not present

## 2023-01-30 DIAGNOSIS — Z79899 Other long term (current) drug therapy: Secondary | ICD-10-CM | POA: Diagnosis not present

## 2023-01-30 DIAGNOSIS — E559 Vitamin D deficiency, unspecified: Secondary | ICD-10-CM | POA: Diagnosis not present

## 2023-01-30 DIAGNOSIS — Z3200 Encounter for pregnancy test, result unknown: Secondary | ICD-10-CM | POA: Diagnosis not present

## 2023-01-30 DIAGNOSIS — Z6833 Body mass index (BMI) 33.0-33.9, adult: Secondary | ICD-10-CM | POA: Diagnosis not present

## 2023-02-11 DIAGNOSIS — Z113 Encounter for screening for infections with a predominantly sexual mode of transmission: Secondary | ICD-10-CM | POA: Diagnosis not present

## 2023-02-11 DIAGNOSIS — R87615 Unsatisfactory cytologic smear of cervix: Secondary | ICD-10-CM | POA: Diagnosis not present

## 2023-02-27 DIAGNOSIS — Z6833 Body mass index (BMI) 33.0-33.9, adult: Secondary | ICD-10-CM | POA: Diagnosis not present

## 2023-02-27 DIAGNOSIS — Z3200 Encounter for pregnancy test, result unknown: Secondary | ICD-10-CM | POA: Diagnosis not present

## 2023-02-27 DIAGNOSIS — M51379 Other intervertebral disc degeneration, lumbosacral region without mention of lumbar back pain or lower extremity pain: Secondary | ICD-10-CM | POA: Diagnosis not present

## 2023-02-27 DIAGNOSIS — Z79899 Other long term (current) drug therapy: Secondary | ICD-10-CM | POA: Diagnosis not present

## 2023-02-27 DIAGNOSIS — E6609 Other obesity due to excess calories: Secondary | ICD-10-CM | POA: Diagnosis not present

## 2023-02-27 DIAGNOSIS — R42 Dizziness and giddiness: Secondary | ICD-10-CM | POA: Diagnosis not present

## 2023-02-27 DIAGNOSIS — E559 Vitamin D deficiency, unspecified: Secondary | ICD-10-CM | POA: Diagnosis not present

## 2024-02-29 ENCOUNTER — Other Ambulatory Visit: Payer: Self-pay

## 2024-02-29 ENCOUNTER — Emergency Department (HOSPITAL_BASED_OUTPATIENT_CLINIC_OR_DEPARTMENT_OTHER)
Admission: EM | Admit: 2024-02-29 | Discharge: 2024-02-29 | Disposition: A | Discharge status: Home / Self Care | Attending: Emergency Medicine | Admitting: Emergency Medicine

## 2024-02-29 ENCOUNTER — Encounter (HOSPITAL_BASED_OUTPATIENT_CLINIC_OR_DEPARTMENT_OTHER): Payer: Self-pay

## 2024-02-29 DIAGNOSIS — T402X1A Poisoning by other opioids, accidental (unintentional), initial encounter: Secondary | ICD-10-CM | POA: Insufficient documentation

## 2024-02-29 DIAGNOSIS — F419 Anxiety disorder, unspecified: Secondary | ICD-10-CM | POA: Insufficient documentation

## 2024-02-29 DIAGNOSIS — T50901A Poisoning by unspecified drugs, medicaments and biological substances, accidental (unintentional), initial encounter: Secondary | ICD-10-CM

## 2024-02-29 NOTE — ED Provider Notes (Signed)
 [place] EMERGENCY DEPARTMENT AT [facility] Provider Note   CSN: [id number] Arrival date & time: 02/29/24  0545     Patient presents with: med error   Robin Aguirre is a 33 y.o. female.   Patient presents to the emergency department because she accidentally took the wrong medication.  Patient reports that she accidentally took her oxycodone  10 mg tablet instead of the muscle relaxer she was trying to take.  Patient reports that the oxycodone  was inadvertently taken 2 hours before her next dose was due.  Patient reports a history of severe anxiety and is very concerned about this medication error.  She is awake, alert, oriented.       Prior to Admission medications  Medication Sig Start Date End Date Taking? Authorizing Provider  Acetaminophen  (MIDOL  PO) Take 2 mg by mouth every 4 (four) hours as needed (cramps). Patient not taking: Reported on 06/26/2021    [provider]  clobetasol (TEMOVATE) 0.05 % external solution Apply topically 2 (two) times daily. Patient not taking: Reported on 06/26/2021 06/12/21   [provider]  clonazePAM (KLONOPIN) 0.5 MG tablet Take 0.5 mg by mouth 2 (two) times daily as needed. 06/13/21   [provider]  Famotidine  (PEPCID  AC PO) Take 1 tablet by mouth 2 (two) times daily as needed (heartburn/indigestion). Patient not taking: Reported on 06/26/2021    [provider]  Risankizumab-rzaa (SKYRIZI PEN) 150 MG/ML SOAJ Inject 150 mg into the skin See admin instructions. Every 3 months    [provider]  sertraline (ZOLOFT) 50 MG tablet Take 50 mg by mouth daily. Patient not taking: Reported on 06/26/2021 06/20/21   [provider]  valACYclovir (VALTREX) 500 MG tablet Take 500 mg by mouth 2 (two) times daily. Patient not taking: Reported on 06/26/2021 06/21/21   [provider]    Allergies: Hydrocodone     Review of Systems  Updated Vital Signs BP 137/76 (BP Location: Left Arm)    Pulse 98   Temp 98.5 F (36.9 C) (Oral)   Resp 18   Ht 5' 5 (1.651 m)   Wt 76.5 kg   LMP 02/27/2024 (Exact Date)   SpO2 100%   BMI 28.06 kg/m   Physical Exam Vitals and nursing note reviewed.  Constitutional:      General: She is not in acute distress.    Appearance: She is well-developed.  HENT:     Head: Normocephalic and atraumatic.     Mouth/Throat:     Mouth: Mucous membranes are moist.  Eyes:     General: Vision grossly intact. Gaze aligned appropriately.     Extraocular Movements: Extraocular movements intact.     Conjunctiva/sclera: Conjunctivae normal.  Cardiovascular:     Rate and Rhythm: Normal rate and regular rhythm.     Pulses: Normal pulses.     Heart sounds: Normal heart sounds, S1 normal and S2 normal. No murmur heard.    No friction rub. No gallop.  Pulmonary:     Effort: Pulmonary effort is normal. No respiratory distress.     Breath sounds: Normal breath sounds.  Abdominal:     General: Bowel sounds are normal.     Palpations: Abdomen is soft.     Tenderness: There is no abdominal tenderness. There is no guarding or rebound.     Hernia: No hernia is present.  Musculoskeletal:        General: No swelling.     Cervical back: Full passive range of motion  without pain, normal range of motion and neck supple. No spinous process tenderness or muscular tenderness. Normal range of motion.     Right lower leg: No edema.     Left lower leg: No edema.  Skin:    General: Skin is warm and dry.     Capillary Refill: Capillary refill takes less than 2 seconds.     Findings: No ecchymosis, erythema, rash or wound.  Neurological:     General: No focal deficit present.     Mental Status: She is alert and oriented to person, place, and time.     GCS: GCS eye subscore is 4. GCS verbal subscore is 5. GCS motor subscore is 6.     Cranial Nerves: Cranial nerves 2-12 are intact.     Sensory: Sensation is intact.     Motor: Motor function is intact.      Coordination: Coordination is intact.  Psychiatric:        Attention and Perception: Attention normal.        Mood and Affect: Mood is anxious.        Speech: Speech normal.        Behavior: Behavior normal.     (all labs ordered are listed, but only abnormal results are displayed) Labs Reviewed - No data to display  EKG: None  Radiology: No results found.   Procedures   Medications Ordered in the ED - No data to display                                  Medical Decision Making  Patient presents with very minor medication error.  Patient took her oxycodone  2 hours before she was due for a dose.  She normally can take it up to every 6 hours.  Patient reports that she grabbed the wrong pill, only realized that after she swallowed it.  She is extremely anxious.  I did reassure her that this error would not be clinically significant at all but she would like to be monitored for a while to make sure that she is safe.     Final diagnoses:  Accidental medication error, initial encounter    ED Discharge Orders     None          Osiris Odriscoll, Lonni PARAS, MD 02/29/24 [phone]

## 2024-02-29 NOTE — Discharge Instructions (Signed)
 Taking the oxycodone  slightly early will not harm you.  You will be fine to continue your normal medication regimens going forward through the rest of the day.

## 2024-02-29 NOTE — ED Provider Notes (Signed)
 Received patient in turnover from Dr. Haze.  Please see their note for further details of Hx, PE.  Briefly patient is a 33 y.o. female with a med error .  Patient took a med 2 hours early.  Concerned came here for eval. patient feeling better on repeat assessment would like to go home.  PCP follow-up.    Emil Share, DO 02/29/24 [phone]

## 2024-02-29 NOTE — ED Triage Notes (Signed)
 PT self ambulated to exam 15 with steady gait. Chief  complaint of taking oxycodone  10 mg x 1 this AM instead of muscle relaxer. PT states it was dark and she grabbed the wrong pill bottle. VSS NAD PT on room air. PT denies HI SI. Pt drove herself.

## 2024-04-02 ENCOUNTER — Encounter (HOSPITAL_BASED_OUTPATIENT_CLINIC_OR_DEPARTMENT_OTHER): Payer: Self-pay | Admitting: Emergency Medicine

## 2024-04-02 ENCOUNTER — Other Ambulatory Visit: Payer: Self-pay

## 2024-04-02 ENCOUNTER — Emergency Department (HOSPITAL_BASED_OUTPATIENT_CLINIC_OR_DEPARTMENT_OTHER)
Admission: EM | Admit: 2024-04-02 | Discharge: 2024-04-03 | Disposition: A | Discharge status: Home / Self Care | Attending: Emergency Medicine | Admitting: Emergency Medicine

## 2024-04-02 DIAGNOSIS — M545 Low back pain, unspecified: Secondary | ICD-10-CM | POA: Diagnosis not present

## 2024-04-02 DIAGNOSIS — G8929 Other chronic pain: Secondary | ICD-10-CM | POA: Diagnosis not present

## 2024-04-02 DIAGNOSIS — M549 Dorsalgia, unspecified: Secondary | ICD-10-CM | POA: Diagnosis present

## 2024-04-02 MED ORDER — KETOROLAC TROMETHAMINE 30 MG/ML IJ SOLN
30.0000 mg | Freq: Once | INTRAMUSCULAR | Status: AC
  Administered 2024-04-03: 30 mg via INTRAMUSCULAR
  Filled 2024-04-02: qty 1

## 2024-04-02 MED ORDER — OXYCODONE-ACETAMINOPHEN 5-325 MG PO TABS
1.0000 | ORAL_TABLET | Freq: Once | ORAL | Status: AC
  Administered 2024-04-03: 1 via ORAL
  Filled 2024-04-02: qty 1

## 2024-04-02 MED ORDER — GABAPENTIN 300 MG PO CAPS
300.0000 mg | ORAL_CAPSULE | Freq: Once | ORAL | Status: AC
  Administered 2024-04-03: 300 mg via ORAL
  Filled 2024-04-02: qty 1

## 2024-04-02 NOTE — ED Triage Notes (Signed)
 Patient c/o lower back pain for years, worse x 2 days.  Patient reports pain radiates down left leg.  Patient has been taking muscle relaxers and oxycodone  with no relief.

## 2024-04-03 MED ORDER — METHYLPREDNISOLONE 4 MG PO TBPK: ORAL_TABLET | ORAL | 0 refills | Status: DC

## 2024-04-03 MED ORDER — IBUPROFEN 600 MG PO TABS: 600.0000 mg | ORAL_TABLET | Freq: Four times a day (QID) | ORAL | 0 refills | Status: DC | PRN

## 2024-04-03 MED ORDER — GABAPENTIN 300 MG PO CAPS: 300.0000 mg | ORAL_CAPSULE | Freq: Two times a day (BID) | ORAL | 0 refills | Status: AC

## 2024-04-03 MED ORDER — METHYLPREDNISOLONE 4 MG PO TBPK: ORAL_TABLET | ORAL | 0 refills | Status: AC

## 2024-04-03 MED ORDER — GABAPENTIN 300 MG PO CAPS: 300.0000 mg | ORAL_CAPSULE | Freq: Two times a day (BID) | ORAL | 0 refills | Status: DC

## 2024-04-03 MED ORDER — IBUPROFEN 600 MG PO TABS: 600.0000 mg | ORAL_TABLET | Freq: Four times a day (QID) | ORAL | 0 refills | Status: AC | PRN

## 2024-04-03 NOTE — Discharge Instructions (Signed)
 You were seen today for back pain.  This appears acute on chronic.  Continue your medications and muscle relaxer at home.  Add ibuprofen  or Medrol  Dosepak for anti-inflammatory effect and follow-up closely your primary doctor.  You may benefit from ongoing physical therapy.

## 2024-04-03 NOTE — ED Provider Notes (Signed)
 " [place] [facility] AT [facility] Provider Note   CSN: [id number] Arrival date & time: 04/02/24  2258     Patient presents with: Back Pain   Robin Aguirre is a 34 y.o. female.   HPI     This is a 34 year old female who presents with concern for back pain.  Patient reports history of chronic back pain.  She has been in pain management and is also in physical therapy.  Reports at baseline my strength on the left side is diminished.  States over the last several days she has had worsening back pain.  It is worse with movement.  No new injury or heavy lifting.  Denies bowel or bladder difficulty.  Denies dysuria.  She has run out of her home oxycodone  and has not scheduled to see her pain management doctor until early next month.  She has been taking a muscle relaxer.  Has not taken any anti-inflammatories.  Prior to Admission medications  Medication Sig Start Date End Date Taking? Authorizing Provider  gabapentin  (NEURONTIN ) 300 MG capsule Take 1 capsule (300 mg total) by mouth 2 (two) times daily. 04/03/24  Yes Manu Rubey, Charmaine FALCON, MD  ibuprofen  (ADVIL ) 600 MG tablet Take 1 tablet (600 mg total) by mouth every 6 (six) hours as needed. 04/03/24  Yes Nalah Macioce, Charmaine FALCON, MD  methylPREDNISolone  (MEDROL  DOSEPAK) 4 MG TBPK tablet Take as directed on packet 04/03/24  Yes Partick Musselman, Charmaine FALCON, MD  Acetaminophen  (MIDOL  PO) Take 2 mg by mouth every 4 (four) hours as needed (cramps). Patient not taking: Reported on 06/26/2021    [provider]  clobetasol (TEMOVATE) 0.05 % external solution Apply topically 2 (two) times daily. Patient not taking: Reported on 06/26/2021 06/12/21   [provider]  clonazePAM (KLONOPIN) 0.5 MG tablet Take 0.5 mg by mouth 2 (two) times daily as needed. 06/13/21   [provider]  Famotidine  (PEPCID  AC PO) Take 1 tablet by mouth 2 (two) times daily as needed (heartburn/indigestion). Patient not taking: Reported on 06/26/2021     [provider]  Risankizumab-rzaa (SKYRIZI PEN) 150 MG/ML SOAJ Inject 150 mg into the skin See admin instructions. Every 3 months    [provider]  sertraline (ZOLOFT) 50 MG tablet Take 50 mg by mouth daily. Patient not taking: Reported on 06/26/2021 06/20/21   [provider]  valACYclovir (VALTREX) 500 MG tablet Take 500 mg by mouth 2 (two) times daily. Patient not taking: Reported on 06/26/2021 06/21/21   [provider]    Allergies: Hydrocodone     Review of Systems  Constitutional:  Negative for fever.  Respiratory:  Negative for shortness of breath.   Cardiovascular:  Negative for chest pain.  Genitourinary:  Negative for dysuria and hematuria.  Musculoskeletal:  Positive for back pain.  All other systems reviewed and are negative.   Updated Vital Signs BP 96/67   Pulse 91   Temp 97.6 F (36.4 C) (Oral)   Resp 20   Wt 86.2 kg   SpO2 100%   BMI 31.62 kg/m   Physical Exam Vitals and nursing note reviewed.  Constitutional:      Appearance: She is well-developed. She is obese. She is not ill-appearing.  HENT:     Head: Normocephalic and atraumatic.  Eyes:     Pupils: Pupils are equal, round, and reactive to light.  Cardiovascular:     Rate and Rhythm: Normal rate and regular rhythm.     Heart sounds: Normal heart  sounds.  Pulmonary:     Effort: Pulmonary effort is normal. No respiratory distress.     Breath sounds: No wheezing.  Abdominal:     Palpations: Abdomen is soft.     Tenderness: There is no abdominal tenderness.  Musculoskeletal:     Cervical back: Neck supple.     Comments: Tenderness palpation left lower paraspinous muscle region, no midline tenderness, step-off, deformity, patient could not tolerate straight leg raise, she would not extend her leg  Skin:    General: Skin is warm and dry.  Neurological:     Mental Status: She is alert and oriented to person, place, and time.     Comments: Equal patellar reflexes  bilaterally, 5 out of 5 plantar and dorsiflexion of the feet bilaterally  Psychiatric:        Mood and Affect: Mood normal.     (all labs ordered are listed, but only abnormal results are displayed) Labs Reviewed - No data to display  EKG: None  Radiology: No results found.   Procedures   Medications Ordered in the ED  ketorolac  (TORADOL ) 30 MG/ML injection 30 mg (30 mg Intramuscular Given 04/03/24 0007)  gabapentin  (NEURONTIN ) capsule 300 mg (300 mg Oral Given 04/03/24 0007)  oxyCODONE -acetaminophen  (PERCOCET/ROXICET) 5-325 MG per tablet 1 tablet (1 tablet Oral Given 04/03/24 0007)                                    Medical Decision Making Risk Prescription drug management.   This patient presents to the ED for concern of back pain, this involves an extensive number of treatment options, and is a complaint that carries with it a high risk of complications and morbidity.  I considered the following differential and admission for this acute, potentially life threatening condition.  The differential diagnosis includes acute on chronic musculoskeletal pain, sciatica, UTI, no signs or symptoms of cauda equina  MDM:    This is a 34 year old female who presents with acute on chronic back pain.  She is nontoxic.  Initially tachycardic.  Is very slow to move in the bed and has difficulty engaging in full exam but no signs or symptoms of cauda equina.  No red flags.  She is afebrile.  She has been ambulatory but reports significant pain.  Patient was given Toradol , gabapentin , and her home oxycodone .  On recheck, she has had some improvement.  Do not feel she needs repeat imaging as there is no new injury.  Recommend addition of anti-inflammatory and gabapentin  for nerve pain.  She is agreeable to this plan.  (Labs, imaging, consults)  Labs: I Ordered, and personally interpreted labs.  The pertinent results include: N/A  Imaging Studies ordered: I ordered imaging studies including  N/A I independently visualized and interpreted imaging. I agree with the radiologist interpretation  Additional history obtained from chart review.  External records from outside source obtained and reviewed including prior evaluations  Cardiac Monitoring: The patient was not maintained on a cardiac monitor.  If on the cardiac monitor, I personally viewed and interpreted the cardiac monitored which showed an underlying rhythm of: N/A  Reevaluation: After the interventions noted above, I reevaluated the patient and found that they have :stayed the same  Social Determinants of Health:  lives independently  Disposition: Discharge  Co morbidities that complicate the patient evaluation  Past Medical History:  Diagnosis Date   Chest pain  Colitis, chronic, ulcerative (HCC)    GERD (gastroesophageal reflux disease)    Headache      Medicines Meds ordered this encounter  Medications   ketorolac  (TORADOL ) 30 MG/ML injection 30 mg   gabapentin  (NEURONTIN ) capsule 300 mg   oxyCODONE -acetaminophen  (PERCOCET/ROXICET) 5-325 MG per tablet 1 tablet    Refill:  0   gabapentin  (NEURONTIN ) 300 MG capsule    Sig: Take 1 capsule (300 mg total) by mouth 2 (two) times daily.    Dispense:  60 capsule    Refill:  0   methylPREDNISolone  (MEDROL  DOSEPAK) 4 MG TBPK tablet    Sig: Take as directed on packet    Dispense:  21 tablet    Refill:  0   ibuprofen  (ADVIL ) 600 MG tablet    Sig: Take 1 tablet (600 mg total) by mouth every 6 (six) hours as needed.    Dispense:  30 tablet    Refill:  0    I have reviewed the patients home medicines and have made adjustments as needed  Problem List / ED Course: Problem List Items Addressed This Visit   None Visit Diagnoses       Chronic left-sided low back pain without sciatica    -  Primary   Relevant Medications   ketorolac  (TORADOL ) 30 MG/ML injection 30 mg (Completed)   gabapentin  (NEURONTIN ) capsule 300 mg (Completed)   oxyCODONE -acetaminophen   (PERCOCET/ROXICET) 5-325 MG per tablet 1 tablet (Completed)   gabapentin  (NEURONTIN ) 300 MG capsule   methylPREDNISolone  (MEDROL  DOSEPAK) 4 MG TBPK tablet   ibuprofen  (ADVIL ) 600 MG tablet                Final diagnoses:  Chronic left-sided low back pain without sciatica    ED Discharge Orders          Ordered    gabapentin  (NEURONTIN ) 300 MG capsule  2 times daily        04/03/24 0138    methylPREDNISolone  (MEDROL  DOSEPAK) 4 MG TBPK tablet        04/03/24 0138    ibuprofen  (ADVIL ) 600 MG tablet  Every 6 hours PRN        04/03/24 0138               Bari Charmaine FALCON, MD 04/03/24 0144  "

## 2024-04-09 ENCOUNTER — Other Ambulatory Visit: Payer: Self-pay

## 2024-04-09 DIAGNOSIS — M5416 Radiculopathy, lumbar region: Secondary | ICD-10-CM

## 2024-04-15 ENCOUNTER — Ambulatory Visit
Admission: RE | Admit: 2024-04-15 | Discharge: 2024-04-15 | Disposition: A | Discharge status: Home / Self Care | Source: Ambulatory Visit

## 2024-04-15 DIAGNOSIS — M5416 Radiculopathy, lumbar region: Secondary | ICD-10-CM
